# Patient Record
Sex: Male | Born: 1995 | Race: Black or African American | Hispanic: No | Marital: Single | Smoking: Never smoker
Health system: Southern US, Community
[De-identification: ages and names within clinical notes are randomized; demographics above are authoritative.]

## PROBLEM LIST (undated history)

## (undated) DIAGNOSIS — A549 Gonococcal infection, unspecified: Secondary | ICD-10-CM

## (undated) DIAGNOSIS — F909 Attention-deficit hyperactivity disorder, unspecified type: Secondary | ICD-10-CM

## (undated) DIAGNOSIS — N390 Urinary tract infection, site not specified: Secondary | ICD-10-CM

## (undated) DIAGNOSIS — A749 Chlamydial infection, unspecified: Secondary | ICD-10-CM

## (undated) HISTORY — DX: Gonococcal infection, unspecified: A54.9

## (undated) HISTORY — DX: Urinary tract infection, site not specified: N39.0

## (undated) HISTORY — DX: Chlamydial infection, unspecified: A74.9

## (undated) HISTORY — PX: HERNIA REPAIR: SHX51

---
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
## 2017-05-02 ENCOUNTER — Emergency Department (HOSPITAL_COMMUNITY)
Admission: EM | Admit: 2017-05-02 | Discharge: 2017-05-02 | Disposition: A | Discharge status: Home / Self Care | Payer: Medicaid Other | Attending: Emergency Medicine | Admitting: Emergency Medicine

## 2017-05-02 ENCOUNTER — Emergency Department (HOSPITAL_COMMUNITY): Payer: Medicaid Other

## 2017-05-02 ENCOUNTER — Encounter (HOSPITAL_COMMUNITY): Payer: Self-pay | Admitting: Emergency Medicine

## 2017-05-02 DIAGNOSIS — F909 Attention-deficit hyperactivity disorder, unspecified type: Secondary | ICD-10-CM | POA: Insufficient documentation

## 2017-05-02 DIAGNOSIS — S3992XA Unspecified injury of lower back, initial encounter: Secondary | ICD-10-CM | POA: Diagnosis present

## 2017-05-02 DIAGNOSIS — W108XXA Fall (on) (from) other stairs and steps, initial encounter: Secondary | ICD-10-CM | POA: Insufficient documentation

## 2017-05-02 DIAGNOSIS — W19XXXA Unspecified fall, initial encounter: Secondary | ICD-10-CM

## 2017-05-02 DIAGNOSIS — Y9259 Other trade areas as the place of occurrence of the external cause: Secondary | ICD-10-CM | POA: Diagnosis not present

## 2017-05-02 DIAGNOSIS — Y990 Civilian activity done for income or pay: Secondary | ICD-10-CM | POA: Diagnosis not present

## 2017-05-02 DIAGNOSIS — Y9301 Activity, walking, marching and hiking: Secondary | ICD-10-CM | POA: Diagnosis not present

## 2017-05-02 DIAGNOSIS — M545 Low back pain, unspecified: Secondary | ICD-10-CM

## 2017-05-02 DIAGNOSIS — Z79899 Other long term (current) drug therapy: Secondary | ICD-10-CM | POA: Insufficient documentation

## 2017-05-02 MED ORDER — NAPROXEN 375 MG PO TABS: 375.0000 mg | ORAL_TABLET | Freq: Two times a day (BID) | ORAL | 0 refills | Status: DC

## 2017-05-02 MED ORDER — IBUPROFEN 200 MG PO TABS
600.0000 mg | ORAL_TABLET | Freq: Once | ORAL | Status: DC
Start: 2017-05-02 — End: 2017-05-02

## 2017-05-02 MED ORDER — METHOCARBAMOL 500 MG PO TABS: 500.0000 mg | ORAL_TABLET | Freq: Two times a day (BID) | ORAL | 0 refills | Status: DC

## 2017-05-02 NOTE — Discharge Instructions (Signed)
Xray showed no fracture.   Please take the Naproxen as prescribed for pain. Do not take any additional NSAIDs including Motrin, Aleve, Ibuprofen, Advil.  Please the the Robaxin for muscle relaxation. This medication will make you drowsy so avoid situation that could place you in danger.   Workup has been normal. Please take medications as prescribed and instructed.  SEEK IMMEDIATE MEDICAL ATTENTION IF: New numbness, tingling, weakness, or problem with the use of your arms or legs.  Severe back pain not relieved with medications.  Change in bowel or bladder control.  Urinary retention.  Numbness in your groin.  Increasing pain in any areas of the body (such as chest or abdominal pain).  Shortness of breath, dizziness or fainting.  Nausea (feeling sick to your stomach), vomiting, fever, or sweats.

## 2017-05-02 NOTE — ED Triage Notes (Signed)
Pt stated that he fell down a few steps last night. The impact was to his coccyx. Pt denies LOC. Pt did not treat yesterday. Pain increased this am. Mother at bedside

## 2017-05-02 NOTE — ED Provider Notes (Signed)
[facility]-EMERGENCY DEPT Provider Note   CSN: [id number] Arrival date & time: 05/02/17  1159     History   Chief Complaint Chief Complaint  Patient presents with  . Fall  . Tailbone Pain    HPI Frank Crawford is a 21 y.o. male.  HPI 21 year old African-American male past medical history significant for ADHD presents to the ED today with complaints of low back and tailbone pain. Patient states that last night he was walking out of work and slipped and fell onto his pulse ox on the metal stairs. Patient denies LOC or head injury. States he felt okay last night however this a.m. the pain increased. Patient has only taken Motrin home for his symptoms last night. Sitting makes the pain worse. Nothing makes the pain better. Patient is able to ambulate. Pt denies any ha, night sweats, hx of ivdu/cancer, loss or bowel or bladder, urinary retention, saddle paresthesias, lower extremity paresthesias. The patient denies any abdominal pain, chest pain, shortness breath, lightheadedness, dizziness, nausea, emesis, urinary symptoms, change in bowel habits.  Past Medical History:  Diagnosis Date  . ADHD (attention deficit hyperactivity disorder)     There are no active problems to display for this patient.   Past Surgical History:  Procedure Laterality Date  . HERNIA REPAIR         Home Medications    Prior to Admission medications   Medication Sig Start Date End Date Taking? Authorizing Provider  ibuprofen (ADVIL,MOTRIN) 600 MG tablet Take 1 tablet (600 mg total) by mouth every 6 (six) hours as needed. 06/11/14   Antony Madura, PA-C  methocarbamol (ROBAXIN) 500 MG tablet Take 1 tablet (500 mg total) by mouth 2 (two) times daily. 05/02/17   Rise Mu, PA-C  naproxen (NAPROSYN) 375 MG tablet Take 1 tablet (375 mg total) by mouth 2 (two) times daily. 05/02/17   Rise Mu, PA-C  pseudoephedrine-guaifenesin (MUCINEX D) 60-600 MG per tablet Take 1 tablet by mouth every 12 (twelve)  hours.    [provider]    Family History Family History  Problem Relation Age of Onset  . Diabetes Other   . Seizures Other   . Seizures Brother     Social History Social History  Substance Use Topics  . Smoking status: Never Smoker  . Smokeless tobacco: Not on file  . Alcohol use No     Allergies   Apple   Review of Systems Review of Systems  Constitutional: Negative for chills and fever.  Eyes: Negative for visual disturbance.  Respiratory: Negative for cough and shortness of breath.   Cardiovascular: Negative for chest pain.  Gastrointestinal: Negative for abdominal pain, diarrhea, nausea and vomiting.  Genitourinary: Negative for decreased urine volume, frequency, hematuria and urgency.  Musculoskeletal: Positive for arthralgias and back pain. Negative for myalgias, neck pain and neck stiffness.  Skin: Negative for rash.  Neurological: Negative for dizziness, syncope, weakness, light-headedness, numbness and headaches.  Psychiatric/Behavioral: Negative for sleep disturbance. The patient is not nervous/anxious.      Physical Exam Updated Vital Signs BP 129/82 (BP Location: Right Arm)   Pulse 68   Temp 98.7 F (37.1 C) (Oral)   Resp 18   SpO2 100%   Physical Exam  Constitutional: He is oriented to person, place, and time. He appears well-developed and well-nourished. No distress.  HENT:  Head: Normocephalic and atraumatic.  Eyes: Right eye exhibits no discharge. Left eye exhibits no discharge. No scleral icterus.  Neck: Normal range of motion.  Cardiovascular: Intact distal pulses.   Pulmonary/Chest: No respiratory distress.  Musculoskeletal: Normal range of motion.  No midline T spine  tenderness. No deformities or step offs noted. Full ROM. Pelvis is stable.  Midline L-spine tenderness without any step-offs or deformities. Bilateral paraspinal tenderness. Tenderness to palpation of the coccyx.   Neurological: He is alert and oriented to  person, place, and time.  Strength 5 over 5 in lower extremities. Patellar reflexes are normal. Sensation intact in all dermatomes.  Skin: Skin is warm and dry. Capillary refill takes less than 2 seconds. No pallor.  Psychiatric: His behavior is normal. Judgment and thought content normal.  Nursing note and vitals reviewed.    ED Treatments / Results  Labs (all labs ordered are listed, but only abnormal results are displayed) Labs Reviewed - No data to display  EKG  EKG Interpretation None       Radiology Dg Lumbar Spine Complete  Result Date: 05/02/2017 CLINICAL DATA:  21 year old male status post fall down stairs last night landing on buttocks. Pain. EXAM: LUMBAR SPINE - COMPLETE 4+ VIEW COMPARISON:  Sacrococcygeal series from today reported separately. Abdominal series 417 2011. FINDINGS: Normal lumbar segmentation. Bone mineralization is within normal limits. The patient is approaching skeletal maturity. Disc spaces are preserved. Vertebral height and alignment within normal limits. No pars fracture. Visible lower thoracic levels appear intact. Visible sacrum appears within normal limits. Negative visible bowel gas pattern. IMPRESSION: Normal radiographic appearance of the lumbar spine. Electronically Signed   By: Odessa Fleming M.D.   On: 05/02/2017 12:49   Dg Sacrum/coccyx  Result Date: 05/02/2017 CLINICAL DATA:  21 year old male status post fall down steps landing on buttocks last night. Pain. EXAM: SACRUM AND COCCYX - 2+ VIEW COMPARISON:  01/08/2010 abdominal series FINDINGS: The patient is nearing skeletal maturity. Bone mineralization is within normal limits. Sacral ala and SI joints appear normal. On the lateral view the sacral segments and coccygeal segments appear within normal limits. The visible lower lumbar levels and pelvis also appear intact. Negative visible bowel gas pattern with some retained stool in the colon. IMPRESSION: No acute fracture identified in the sacrum or coccyx.  Electronically Signed   By: Odessa Fleming M.D.   On: 05/02/2017 12:48    Procedures Procedures (including critical care time)  Medications Ordered in ED Medications  ibuprofen (ADVIL,MOTRIN) tablet 600 mg (not administered)     Initial Impression / Assessment and Plan / ED Course  I have reviewed the triage vital signs and the nursing notes.  Pertinent labs & imaging results that were available during my care of the patient were reviewed by me and considered in my medical decision making (see chart for details).     Patient with back pain after mechanical fall last night.  No neurological deficits and normal neuro exam.  Patient can walk but states is painful.  No loss of bowel or bladder control.  No concern for cauda equina.  No fever, night sweats, weight loss, h/o cancer, IVDU.  RICE protocol and pain medicine indicated and discussed with patient.    Final Clinical Impressions(s) / ED Diagnoses   Final diagnoses:  Fall  Acute midline low back pain without sciatica  Tailbone injury, initial encounter    New Prescriptions New Prescriptions   METHOCARBAMOL (ROBAXIN) 500 MG TABLET    Take 1 tablet (500 mg total) by mouth 2 (two) times daily.   NAPROXEN (NAPROSYN) 375 MG TABLET    Take 1 tablet (375 mg total)  by mouth 2 (two) times daily.     Rise MuLeaphart, Kenneth T, PA-C 05/02/17 1310    Melene PlanFloyd, Dan, DO 05/02/17 1635

## 2018-02-21 ENCOUNTER — Ambulatory Visit: Payer: Self-pay | Admitting: Family Medicine

## 2019-07-25 ENCOUNTER — Emergency Department (HOSPITAL_COMMUNITY): Payer: Medicaid Other

## 2019-07-25 ENCOUNTER — Other Ambulatory Visit: Payer: Self-pay

## 2019-07-25 ENCOUNTER — Emergency Department (HOSPITAL_COMMUNITY)
Admission: EM | Admit: 2019-07-25 | Discharge: 2019-07-25 | Disposition: A | Discharge status: Home / Self Care | Payer: Medicaid Other | Attending: Emergency Medicine | Admitting: Emergency Medicine

## 2019-07-25 DIAGNOSIS — W228XXA Striking against or struck by other objects, initial encounter: Secondary | ICD-10-CM | POA: Diagnosis not present

## 2019-07-25 DIAGNOSIS — S6991XA Unspecified injury of right wrist, hand and finger(s), initial encounter: Secondary | ICD-10-CM | POA: Diagnosis present

## 2019-07-25 DIAGNOSIS — Y929 Unspecified place or not applicable: Secondary | ICD-10-CM | POA: Insufficient documentation

## 2019-07-25 DIAGNOSIS — Y999 Unspecified external cause status: Secondary | ICD-10-CM | POA: Insufficient documentation

## 2019-07-25 DIAGNOSIS — S62336A Displaced fracture of neck of fifth metacarpal bone, right hand, initial encounter for closed fracture: Secondary | ICD-10-CM | POA: Insufficient documentation

## 2019-07-25 DIAGNOSIS — Y9389 Activity, other specified: Secondary | ICD-10-CM | POA: Insufficient documentation

## 2019-07-25 DIAGNOSIS — Z79899 Other long term (current) drug therapy: Secondary | ICD-10-CM | POA: Insufficient documentation

## 2019-07-25 MED ORDER — IBUPROFEN 600 MG PO TABS: 600.0000 mg | ORAL_TABLET | Freq: Four times a day (QID) | ORAL | 0 refills | Status: DC | PRN

## 2019-07-25 MED ORDER — HYDROCODONE-ACETAMINOPHEN 5-325 MG PO TABS
1.0000 | ORAL_TABLET | Freq: Once | ORAL | Status: AC
  Administered 2019-07-25: 13:00:00 1 via ORAL
  Filled 2019-07-25: qty 1
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
## 2019-07-25 NOTE — Discharge Instructions (Addendum)
Please read instructions below. Keep your splint clean and dry and in place at all times.  Elevate your hand as much as possible to help with swelling. You can take ibuprofen every 6 hours as needed for pain. Schedule an appointment with the orthopedic hand specialist in 1-2 weeks for repeat x-ray and follow-up on your injury. Return to the ER for new or concerning symptoms.
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
## 2020-07-21 ENCOUNTER — Emergency Department (INDEPENDENT_AMBULATORY_CARE_PROVIDER_SITE_OTHER)
Admission: EM | Admit: 2020-07-21 | Discharge: 2020-07-21 | Disposition: A | Discharge status: Home / Self Care | Payer: Medicaid Other | Source: Home / Self Care

## 2020-07-21 ENCOUNTER — Other Ambulatory Visit: Payer: Self-pay

## 2020-07-21 DIAGNOSIS — Z202 Contact with and (suspected) exposure to infections with a predominantly sexual mode of transmission: Secondary | ICD-10-CM

## 2020-07-21 MED ORDER — DOXYCYCLINE HYCLATE 100 MG PO CAPS: 100.0000 mg | ORAL_CAPSULE | Freq: Two times a day (BID) | ORAL | 0 refills | Status: AC

## 2020-07-21 MED ORDER — DOXYCYCLINE HYCLATE 100 MG PO CAPS: 100.0000 mg | ORAL_CAPSULE | Freq: Two times a day (BID) | ORAL | 0 refills | Status: DC

## 2020-07-21 MED ORDER — CEFTRIAXONE SODIUM 500 MG IJ SOLR
500.0000 mg | Freq: Once | INTRAMUSCULAR | Status: AC
  Administered 2020-07-21: 500 mg via INTRAMUSCULAR

## 2020-07-21 NOTE — Discharge Instructions (Signed)
°  Refrain from sexual intercourse for 7 days. Be sure to have all partners tested and treated for STDs.  Practice safe sex by always wearing condoms.  ° °

## 2020-07-21 NOTE — ED Triage Notes (Signed)
Pt presents for STI screening. He is an relationship with his fiance which was diagnosed with gonorrhea.

## 2020-07-21 NOTE — ED Provider Notes (Signed)
[name]    CSN: [id number] Arrival date & time: 07/21/20  1012      History   Chief Complaint Chief Complaint  Patient presents with  . Exposure to STD    HPI Frank Crawford is a 24 y.o. male.   HPI  Frank Crawford is a 24 y.o. male presenting to UC with request for STI testing and treatment after being exposed to gonorrhea. Pt reports his fiance tested positive for gonorrhea recently. Pt reports mild intermittent lower back pain and abdominal pain. Denies dysuria or penile discharge. He has had unprotected sex with his male partner. Denies fever, chills, n/v/d.    Past Medical History:  Diagnosis Date  . ADHD (attention deficit hyperactivity disorder)     There are no problems to display for this patient.   Past Surgical History:  Procedure Laterality Date  . HERNIA REPAIR         Home Medications    Prior to Admission medications   Medication Sig Start Date End Date Taking? Authorizing Provider  doxycycline (VIBRAMYCIN) 100 MG capsule Take 1 capsule (100 mg total) by mouth 2 (two) times daily for 7 days. 07/21/20 07/28/20  Lurene Shadow, PA-C  ibuprofen (ADVIL) 600 MG tablet Take 1 tablet (600 mg total) by mouth every 6 (six) hours as needed. 07/25/19   Robinson, [place] N, PA-C  methocarbamol (ROBAXIN) 500 MG tablet Take 1 tablet (500 mg total) by mouth 2 (two) times daily. 05/02/17   Rise Mu, PA-C  naproxen (NAPROSYN) 375 MG tablet Take 1 tablet (375 mg total) by mouth 2 (two) times daily. 05/02/17   Rise Mu, PA-C  pseudoephedrine-guaifenesin (MUCINEX D) 60-600 MG per tablet Take 1 tablet by mouth every 12 (twelve) hours.    [provider]    Family History Family History  Problem Relation Age of Onset  . Diabetes Other   . Seizures Other   . Seizures Brother   . Diabetes Mother   . Hypertension Father     Social History Social History   Tobacco Use  . Smoking status: Never Smoker  . Smokeless tobacco:  Never Used  Vaping Use  . Vaping Use: Every day  Substance Use Topics  . Alcohol use: Yes    Alcohol/week: 2.0 standard drinks    Types: 2 Shots of liquor per week    Comment: occasional   . Drug use: No     Allergies   Apple   Review of Systems Review of Systems  Constitutional: Negative for chills and fever.  Gastrointestinal: Positive for abdominal pain. Negative for diarrhea, nausea and vomiting.  Genitourinary: Negative for discharge, dysuria, frequency, genital sores, hematuria and testicular pain.  Musculoskeletal: Positive for back pain.  Skin: Negative for rash.     Physical Exam Triage Vital Signs ED Triage Vitals  Enc Vitals Group     BP 07/21/20 1039 139/84     Pulse Rate 07/21/20 1039 78     Resp 07/21/20 1039 18     Temp 07/21/20 1039 99 F (37.2 C)     Temp Source 07/21/20 1039 Oral     SpO2 07/21/20 1039 99 %     Weight 07/21/20 1033 162 lb (73.5 kg)     Height 07/21/20 1033 5\' 7"  (1.702 m)     Head Circumference --      Peak Flow --      Pain Score 07/21/20 1042 0     Pain Loc --  Pain Edu? --      Excl. in GC? --    No data found.  Updated Vital Signs BP 139/84 (BP Location: Left Arm)   Pulse 78   Temp 99 F (37.2 C) (Oral)   Resp 18   Ht 5\' 7"  (1.702 m)   Wt 162 lb (73.5 kg)   SpO2 99%   BMI 25.37 kg/m   Visual Acuity Right Eye Distance:   Left Eye Distance:   Bilateral Distance:    Right Eye Near:   Left Eye Near:    Bilateral Near:     Physical Exam Vitals and nursing note reviewed.  Constitutional:      General: He is not in acute distress.    Appearance: Normal appearance. He is well-developed. He is not ill-appearing, toxic-appearing or diaphoretic.  HENT:     Head: Normocephalic and atraumatic.  Cardiovascular:     Rate and Rhythm: Normal rate and regular rhythm.  Pulmonary:     Effort: Pulmonary effort is normal. No respiratory distress.     Breath sounds: Normal breath sounds.  Abdominal:     General:  There is no distension.     Palpations: Abdomen is soft.     Tenderness: There is abdominal tenderness (mild suprapubic). There is no right CVA tenderness or left CVA tenderness.  Genitourinary:    Comments: deferred Musculoskeletal:        General: Normal range of motion.     Cervical back: Normal range of motion.  Skin:    General: Skin is warm and dry.  Neurological:     Mental Status: He is alert and oriented to person, place, and time.  Psychiatric:        Behavior: Behavior normal.      UC Treatments / Results  Labs (all labs ordered are listed, but only abnormal results are displayed) Labs Reviewed  C. TRACHOMATIS/N. GONORRHOEAE RNA    EKG   Radiology No results found.  Procedures Procedures (including critical care time)  Medications Ordered in UC Medications  cefTRIAXone (ROCEPHIN) injection 500 mg (500 mg Intramuscular Given 07/21/20 1101)    Initial Impression / Assessment and Plan / UC Course  I have reviewed the triage vital signs and the nursing notes.  Pertinent labs & imaging results that were available during my care of the patient were reviewed by me and considered in my medical decision making (see chart for details).     Will tx empirically for gonorrhea Urine sample sent to lab Pt declined testing for HIV or syphilis  F/u with PCP as needed AVS given  Final Clinical Impressions(s) / UC Diagnoses   Final diagnoses:  Exposure to gonorrhea     Discharge Instructions      Refrain from sexual intercourse for 7 days. Be sure to have all partners tested and treated for STDs.  Practice safe sex by always wearing condoms.      ED Prescriptions    Medication Sig Dispense Auth. Provider   doxycycline (VIBRAMYCIN) 100 MG capsule  (Status: Discontinued) Take 1 capsule (100 mg total) by mouth 2 (two) times daily for 7 days. 14 capsule 07/23/20, Kirsten Mckone O, PA-C   doxycycline (VIBRAMYCIN) 100 MG capsule Take 1 capsule (100 mg total) by mouth 2  (two) times daily for 7 days. 14 capsule Doroteo Glassman, Lurene Shadow     PDMP not reviewed this encounter.   [place], Lurene Shadow 07/21/20 1159

## 2020-07-22 LAB — C. TRACHOMATIS/N. GONORRHOEAE RNA
C. trachomatis RNA, TMA: NOT DETECTED
N. gonorrhoeae RNA, TMA: NOT DETECTED

## 2020-08-16 ENCOUNTER — Other Ambulatory Visit: Payer: Self-pay

## 2020-08-16 ENCOUNTER — Encounter: Payer: Self-pay | Admitting: Emergency Medicine

## 2020-08-16 ENCOUNTER — Emergency Department (INDEPENDENT_AMBULATORY_CARE_PROVIDER_SITE_OTHER)
Admission: EM | Admit: 2020-08-16 | Discharge: 2020-08-16 | Disposition: A | Discharge status: Home / Self Care | Payer: Medicaid Other | Source: Home / Self Care

## 2020-08-16 DIAGNOSIS — R1030 Lower abdominal pain, unspecified: Secondary | ICD-10-CM

## 2020-08-16 DIAGNOSIS — R350 Frequency of micturition: Secondary | ICD-10-CM

## 2020-08-16 LAB — POCT URINALYSIS DIP (MANUAL ENTRY)
Bilirubin, UA: NEGATIVE
Blood, UA: NEGATIVE
Glucose, UA: NEGATIVE mg/dL
Ketones, POC UA: NEGATIVE mg/dL
Leukocytes, UA: NEGATIVE
Nitrite, UA: NEGATIVE
Spec Grav, UA: 1.025 (ref 1.010–1.025)
Urobilinogen, UA: 0.2 E.U./dL
pH, UA: 6.5 (ref 5.0–8.0)

## 2020-08-16 MED ORDER — POLYETHYLENE GLYCOL 3350 17 GM/SCOOP PO POWD: 15.0000 g | Freq: Once | ORAL | 0 refills | Status: AC

## 2020-08-16 NOTE — ED Provider Notes (Signed)
[facility]    CSN: [id number] Arrival date & time: 08/16/20  1028      History   Chief Complaint Chief Complaint  Patient presents with  . Abdominal Pain    HPI Frank Crawford is a 24 y.o. male.   This is an established [facility] patient, age 65 years old.  Nausea, stomach pain, frequent urination, urgency started today. Was treated for gonorrhea recently. Vaccinated     Past Medical History:  Diagnosis Date  . ADHD (attention deficit hyperactivity disorder)     There are no problems to display for this patient.   Past Surgical History:  Procedure Laterality Date  . HERNIA REPAIR         Home Medications    Prior to Admission medications   Medication Sig Start Date End Date Taking? Authorizing Provider  ibuprofen (ADVIL) 600 MG tablet Take 1 tablet (600 mg total) by mouth every 6 (six) hours as needed. 07/25/19   Robinson, [place] N, PA-C  polyethylene glycol powder (MIRALAX) 17 GM/SCOOP powder Take 15 g by mouth once for 1 dose. One scoop in 8 oz water daily 08/16/20 08/16/20  Elvina Sidle, MD    Family History Family History  Problem Relation Age of Onset  . Diabetes Other   . Seizures Other   . Seizures Brother   . Diabetes Mother   . Hypertension Father     Social History Social History   Tobacco Use  . Smoking status: Never Smoker  . Smokeless tobacco: Never Used  Vaping Use  . Vaping Use: Every day  Substance Use Topics  . Alcohol use: Yes    Alcohol/week: 2.0 standard drinks    Types: 2 Shots of liquor per week    Comment: occasional   . Drug use: No     Allergies   Apple   Review of Systems Review of Systems  Constitutional: Negative.   Gastrointestinal: Positive for abdominal distention and abdominal pain. Negative for nausea and vomiting.  Genitourinary: Positive for frequency and urgency. Negative for discharge and dysuria.     Physical Exam Triage Vital Signs ED Triage Vitals  Enc  Vitals Group     BP 08/16/20 1045 (!) 135/91     Pulse Rate 08/16/20 1045 68     Resp 08/16/20 1045 16     Temp 08/16/20 1045 98 F (36.7 C)     Temp Source 08/16/20 1045 Oral     SpO2 08/16/20 1045 98 %     Weight 08/16/20 1047 166 lb (75.3 kg)     Height 08/16/20 1047 5\' 7"  (1.702 m)     Head Circumference --      Peak Flow --      Pain Score 08/16/20 1047 6     Pain Loc --      Pain Edu? --      Excl. in GC? --    No data found.  Updated Vital Signs BP (!) 135/91 (BP Location: Right Arm)   Pulse 68   Temp 98 F (36.7 C) (Oral)   Resp 16   Ht 5\' 7"  (1.702 m)   Wt 75.3 kg   SpO2 98%   BMI 26.00 kg/m    Physical Exam Vitals and nursing note reviewed.  Constitutional:      General: He is not in acute distress.    Appearance: He is well-developed and normal weight.  HENT:     Head: Normocephalic.  Cardiovascular:  Rate and Rhythm: Normal rate.  Pulmonary:     Effort: Pulmonary effort is normal.  Abdominal:     General: Abdomen is flat. Bowel sounds are normal.     Palpations: Abdomen is soft.     Tenderness: There is abdominal tenderness in the right lower quadrant. There is no guarding or rebound. Negative signs include Murphy's sign and McBurney's sign.  Skin:    General: Skin is warm and dry.  Neurological:     General: No focal deficit present.     Mental Status: He is alert.  Psychiatric:        Mood and Affect: Mood normal.        Behavior: Behavior normal.      UC Treatments / Results  Labs (all labs ordered are listed, but only abnormal results are displayed) Labs Reviewed  POCT URINALYSIS DIP (MANUAL ENTRY) - Abnormal; Notable for the following components:      Result Value   Protein Ur, POC trace (*)    All other components within normal limits    EKG   Radiology No results found.  Procedures Procedures (including critical care time)  Medications Ordered in UC Medications - No data to display  Initial Impression / Assessment  and Plan / UC Course  I have reviewed the triage vital signs and the nursing notes.  Pertinent labs & imaging results that were available during my care of the patient were reviewed by me and considered in my medical decision making (see chart for details).    Final Clinical Impressions(s) / UC Diagnoses   Final diagnoses:  Lower abdominal pain  Frequency of urination     Discharge Instructions     Drink plenty of fluid daily.  Urine test is normal.    ED Prescriptions    Medication Sig Dispense Auth. Provider   polyethylene glycol powder (MIRALAX) 17 GM/SCOOP powder Take 15 g by mouth once for 1 dose. One scoop in 8 oz water daily 255 g Elvina Sidle, MD     PDMP not reviewed this encounter.   Elvina Sidle, MD 08/16/20 [phone]

## 2020-08-16 NOTE — ED Triage Notes (Addendum)
Nausea, stomach pain, frequent urination, urgency started today. Was treated for gonorrhea recently. Vaccinated

## 2020-08-16 NOTE — Discharge Instructions (Signed)
Drink plenty of fluid daily.  Urine test is normal.

## 2020-09-19 ENCOUNTER — Emergency Department (INDEPENDENT_AMBULATORY_CARE_PROVIDER_SITE_OTHER)
Admission: EM | Admit: 2020-09-19 | Discharge: 2020-09-19 | Disposition: A | Discharge status: Home / Self Care | Payer: Medicaid Other | Source: Home / Self Care

## 2020-09-19 ENCOUNTER — Other Ambulatory Visit: Payer: Self-pay

## 2020-09-19 ENCOUNTER — Encounter: Payer: Self-pay | Admitting: Emergency Medicine

## 2020-09-19 DIAGNOSIS — R519 Headache, unspecified: Secondary | ICD-10-CM | POA: Diagnosis not present

## 2020-09-19 MED ORDER — AMOXICILLIN 500 MG PO CAPS: 500.0000 mg | ORAL_CAPSULE | Freq: Three times a day (TID) | ORAL | 0 refills | Status: DC

## 2020-09-19 NOTE — ED Provider Notes (Signed)
[name]    CSN: [id number] Arrival date & time: 09/19/20  1912      History   Chief Complaint Chief Complaint  Patient presents with  . Headache    HPI Frank Crawford is a 24 y.o. male.  Complains of headache nausea for 2 days.  Has been vaccinated against Covid and has no concerns letter.  Also has had some cough productive of greenish sputum.  He complains of tenderness in the maxillary sinus area.   HPI  Past Medical History:  Diagnosis Date  . ADHD (attention deficit hyperactivity disorder)     There are no problems to display for this patient.   Past Surgical History:  Procedure Laterality Date  . HERNIA REPAIR         Home Medications    Prior to Admission medications   Medication Sig Start Date End Date Taking? Authorizing Provider  ibuprofen (ADVIL) 600 MG tablet Take 1 tablet (600 mg total) by mouth every 6 (six) hours as needed. 07/25/19   Robinson, [place] N, PA-C    Family History Family History  Problem Relation Age of Onset  . Diabetes Other   . Seizures Other   . Seizures Brother   . Diabetes Mother   . Hypertension Father     Social History Social History   Tobacco Use  . Smoking status: Never Smoker  . Smokeless tobacco: Never Used  Vaping Use  . Vaping Use: Every day  Substance Use Topics  . Alcohol use: Yes    Alcohol/week: 2.0 standard drinks    Types: 2 Shots of liquor per week    Comment: occasional   . Drug use: No     Allergies   Apple   Review of Systems Review of Systems  HENT: Positive for sinus pressure.   Respiratory: Positive for cough.   Neurological: Positive for headaches.  All other systems reviewed and are negative.    Physical Exam Triage Vital Signs ED Triage Vitals  Enc Vitals Group     BP 09/19/20 1930 137/90     Pulse Rate 09/19/20 1930 70     Resp 09/19/20 1930 18     Temp 09/19/20 1930 98.6 F (37 C)     Temp Source 09/19/20 1930 Oral     SpO2 09/19/20 1930 98 %      Weight 09/19/20 1931 168 lb (76.2 kg)     Height 09/19/20 1931 5\' 7"  (1.702 m)     Head Circumference --      Peak Flow --      Pain Score 09/19/20 1930 9     Pain Loc --      Pain Edu? --      Excl. in GC? --    No data found.  Updated Vital Signs BP 137/90 (BP Location: Right Arm)   Pulse 70   Temp 98.6 F (37 C) (Oral)   Resp 18   Ht 5\' 7"  (1.702 m)   Wt 76.2 kg   SpO2 98%   BMI 26.31 kg/m   Visual Acuity Right Eye Distance:   Left Eye Distance:   Bilateral Distance:    Right Eye Near:   Left Eye Near:    Bilateral Near:     Physical Exam Vitals and nursing note reviewed.  Constitutional:      Appearance: He is well-developed.  HENT:     Head:     Comments: Tenderness with percussion maxillary sinuses    Mouth/Throat:  Mouth: Mucous membranes are moist.  Cardiovascular:     Rate and Rhythm: Normal rate and regular rhythm.  Pulmonary:     Effort: Pulmonary effort is normal.     Breath sounds: Normal breath sounds.  Neurological:     Mental Status: He is alert.      UC Treatments / Results  Labs (all labs ordered are listed, but only abnormal results are displayed) Labs Reviewed - No data to display  EKG   Radiology No results found.  Procedures Procedures (including critical care time)  Medications Ordered in UC Medications - No data to display  Initial Impression / Assessment and Plan / UC Course  I have reviewed the triage vital signs and the nursing notes.  Pertinent labs & imaging results that were available during my care of the patient were reviewed by me and considered in my medical decision making (see chart for details).     Headache, possibly sinusitis Final Clinical Impressions(s) / UC Diagnoses   Final diagnoses:  None   Discharge Instructions   None    ED Prescriptions    None     PDMP not reviewed this encounter.   Frederica Kuster, MD 09/19/20 [phone]

## 2020-09-19 NOTE — ED Triage Notes (Signed)
Migraine, nausea x 2 days

## 2020-10-05 ENCOUNTER — Other Ambulatory Visit: Payer: Self-pay

## 2020-10-05 ENCOUNTER — Emergency Department (INDEPENDENT_AMBULATORY_CARE_PROVIDER_SITE_OTHER)
Admission: EM | Admit: 2020-10-05 | Discharge: 2020-10-05 | Disposition: A | Discharge status: Home / Self Care | Payer: Medicaid Other | Source: Home / Self Care

## 2020-10-05 ENCOUNTER — Encounter: Payer: Self-pay | Admitting: Emergency Medicine

## 2020-10-05 DIAGNOSIS — B349 Viral infection, unspecified: Secondary | ICD-10-CM | POA: Diagnosis not present

## 2020-10-05 NOTE — Discharge Instructions (Signed)
Your COVID 19 results should result within 3-5 days. Negative results are immediately resulted to Mychart. Positive results will receive a follow-up call from our clinic. If symptoms are present, I recommend home quarantine until results are known.  Alternate Tylenol and ibuprofen as needed for body aches and fever.  Xyzal or cetirizine for nasal symptom management. Warm salt water gargles for throat pain management. Symptom management per recommendations discussed today.  If any breathing difficulty or chest pain develops go immediately to the closest emergency department for evaluation.

## 2020-10-05 NOTE — ED Triage Notes (Signed)
Body aches, cough, headache, fatigue, fever, chills x yesterday Vaccinated

## 2020-10-05 NOTE — ED Provider Notes (Addendum)
[name]    CSN: [id number] Arrival date & time: 10/05/20  1511      History   Chief Complaint No chief complaint on file.   HPI Frank Crawford is a 25 y.o. male.   HPI  Patient presents today with body aches, cough, headache, fatigue, fever, chills x1 day.  He has a low-grade fever on arrival here today. He is fully vaccinated. Taking OTC management for fever and body aches management. Denies SOB, wheezing, N&V, and chest pain.   Past Medical History:  Diagnosis Date  . ADHD (attention deficit hyperactivity disorder)     There are no problems to display for this patient.   Past Surgical History:  Procedure Laterality Date  . HERNIA REPAIR         Home Medications    Prior to Admission medications   Medication Sig Start Date End Date Taking? Authorizing Provider  Pseudoephedrine-APAP-DM (DAYQUIL MULTI-SYMPTOM COLD/FLU PO) Take by mouth.   Yes [provider]  ibuprofen (ADVIL) 600 MG tablet Take 1 tablet (600 mg total) by mouth every 6 (six) hours as needed. 07/25/19   Robinson, [place] N, PA-C    Family History Family History  Problem Relation Age of Onset  . Diabetes Other   . Seizures Other   . Seizures Brother   . Diabetes Mother   . Hypertension Father     Social History Social History   Tobacco Use  . Smoking status: Never Smoker  . Smokeless tobacco: Never Used  Vaping Use  . Vaping Use: Every day  Substance Use Topics  . Alcohol use: Yes    Alcohol/week: 2.0 standard drinks    Types: 2 Shots of liquor per week    Comment: occasional   . Drug use: No     Allergies   Apple   Review of Systems Review of Systems Pertinent negatives listed in HPI   Physical Exam Triage Vital Signs ED Triage Vitals  Enc Vitals Group     BP 10/05/20 1623 124/85     Pulse Rate 10/05/20 1623 95     Resp 10/05/20 1623 20     Temp 10/05/20 1623 99.1 F (37.3 C)     Temp Source 10/05/20 1623 Oral     SpO2 10/05/20 1623 96 %      Weight 10/05/20 1624 168 lb (76.2 kg)     Height 10/05/20 1624 5\' 7"  (1.702 m)     Head Circumference --      Peak Flow --      Pain Score 10/05/20 1624 9     Pain Loc --      Pain Edu? --      Excl. in GC? --    No data found.  Updated Vital Signs BP 124/85 (BP Location: Right Arm)   Pulse 95   Temp 99.1 F (37.3 C) (Oral)   Resp 20   Ht 5\' 7"  (1.702 m)   Wt 168 lb (76.2 kg)   SpO2 96%   BMI 26.31 kg/m   Visual Acuity Right Eye Distance:   Left Eye Distance:   Bilateral Distance:    Right Eye Near:   Left Eye Near:    Bilateral Near:     Physical Exam  General Appearance:    Alert, acutely ill appearing,  cooperative, no distress  HENT:   Normocephalic, ears normal, nares mucosal edema with congestion, rhinorrhea, oropharynx w/o edema/erythema/exudate  Eyes:    PERRL, conjunctiva/corneas clear, EOM's intact  Lungs:     Clear to auscultation bilaterally, respirations unlabored  Heart:    Regular rate and rhythm  Neurologic:   Awake, alert, oriented x 3. No apparent focal neurological           defect.      UC Treatments / Results  Labs (all labs ordered are listed, but only abnormal results are displayed) Labs Reviewed - No data to display  EKG   Radiology No results found.  Procedures Procedures (including critical care time)  Medications Ordered in UC Medications - No data to display  Initial Impression / Assessment and Plan / UC Course  I have reviewed the triage vital signs and the nursing notes.  Pertinent labs & imaging results that were available during my care of the patient were reviewed by me and considered in my medical decision making (see chart for details).    COVID/Flu test pending. Symptom management warranted only.  Manage fever with Tylenol and ibuprofen.  Nasal symptoms with over-the-counter antihistamines recommended.  Treatment per discharge medications/discharge instructions.  Red flags/ER precautions given. The most  current CDC isolation/quarantine recommendation advised.  Final Clinical Impressions(s) / UC Diagnoses   Final diagnoses:  Viral illness     Discharge Instructions     Your COVID 19 results should result within 3-5 days. Negative results are immediately resulted to Mychart. Positive results will receive a follow-up call from our clinic. If symptoms are present, I recommend home quarantine until results are known.  Alternate Tylenol and ibuprofen as needed for body aches and fever.  Xyzal or cetirizine for nasal symptom management. Warm salt water gargles for throat pain management. Symptom management per recommendations discussed today.  If any breathing difficulty or chest pain develops go immediately to the closest emergency department for evaluation.     ED Prescriptions    None     PDMP not reviewed this encounter.   Bing Neighbors, FNP 10/09/20 0033    Bing Neighbors, FNP 10/09/20 [phone]

## 2020-10-09 LAB — COVID-19, FLU A+B NAA
Influenza A, NAA: NOT DETECTED
Influenza B, NAA: NOT DETECTED
SARS-CoV-2, NAA: DETECTED — AB

## 2021-01-09 ENCOUNTER — Encounter (HOSPITAL_BASED_OUTPATIENT_CLINIC_OR_DEPARTMENT_OTHER): Payer: Self-pay | Admitting: *Deleted

## 2021-01-09 ENCOUNTER — Other Ambulatory Visit: Payer: Self-pay

## 2021-01-09 ENCOUNTER — Emergency Department (HOSPITAL_BASED_OUTPATIENT_CLINIC_OR_DEPARTMENT_OTHER)
Admission: EM | Admit: 2021-01-09 | Discharge: 2021-01-10 | Disposition: A | Discharge status: Home / Self Care | Payer: Medicaid Other | Attending: Emergency Medicine | Admitting: Emergency Medicine

## 2021-01-09 DIAGNOSIS — R110 Nausea: Secondary | ICD-10-CM | POA: Insufficient documentation

## 2021-01-09 DIAGNOSIS — S39011A Strain of muscle, fascia and tendon of abdomen, initial encounter: Secondary | ICD-10-CM | POA: Diagnosis not present

## 2021-01-09 DIAGNOSIS — X58XXXA Exposure to other specified factors, initial encounter: Secondary | ICD-10-CM | POA: Diagnosis not present

## 2021-01-09 DIAGNOSIS — S3991XA Unspecified injury of abdomen, initial encounter: Secondary | ICD-10-CM | POA: Diagnosis present

## 2021-01-09 LAB — URINALYSIS, ROUTINE W REFLEX MICROSCOPIC
Bilirubin Urine: NEGATIVE
Glucose, UA: NEGATIVE mg/dL
Hgb urine dipstick: NEGATIVE
Ketones, ur: NEGATIVE mg/dL
Leukocytes,Ua: NEGATIVE
Nitrite: NEGATIVE
Protein, ur: NEGATIVE mg/dL
Specific Gravity, Urine: 1.015 (ref 1.005–1.030)
pH: 7 (ref 5.0–8.0)

## 2021-01-09 NOTE — ED Triage Notes (Signed)
Abdominal pain for a week. Sharp pain mid abdomen. States he had a hernia repair in the area of the pain.

## 2021-01-10 ENCOUNTER — Emergency Department (HOSPITAL_BASED_OUTPATIENT_CLINIC_OR_DEPARTMENT_OTHER): Payer: Medicaid Other

## 2021-01-10 LAB — CBC WITH DIFFERENTIAL/PLATELET
Abs Immature Granulocytes: 0.01 10*3/uL (ref 0.00–0.07)
Basophils Absolute: 0 10*3/uL (ref 0.0–0.1)
Basophils Relative: 1 %
Eosinophils Absolute: 0.2 10*3/uL (ref 0.0–0.5)
Eosinophils Relative: 3 %
HCT: 44 % (ref 39.0–52.0)
Hemoglobin: 14.9 g/dL (ref 13.0–17.0)
Immature Granulocytes: 0 %
Lymphocytes Relative: 48 %
Lymphs Abs: 3.2 10*3/uL (ref 0.7–4.0)
MCH: 27.7 pg (ref 26.0–34.0)
MCHC: 33.9 g/dL (ref 30.0–36.0)
MCV: 81.8 fL (ref 80.0–100.0)
Monocytes Absolute: 0.9 10*3/uL (ref 0.1–1.0)
Monocytes Relative: 14 %
Neutro Abs: 2.2 10*3/uL (ref 1.7–7.7)
Neutrophils Relative %: 34 %
Platelets: 314 10*3/uL (ref 150–400)
RBC: 5.38 MIL/uL (ref 4.22–5.81)
RDW: 11.9 % (ref 11.5–15.5)
WBC: 6.5 10*3/uL (ref 4.0–10.5)
nRBC: 0 % (ref 0.0–0.2)

## 2021-01-10 LAB — BASIC METABOLIC PANEL WITH GFR
Anion gap: 10 (ref 5–15)
BUN: 14 mg/dL (ref 6–20)
CO2: 25 mmol/L (ref 22–32)
Calcium: 9 mg/dL (ref 8.9–10.3)
Chloride: 101 mmol/L (ref 98–111)
Creatinine, Ser: 0.76 mg/dL (ref 0.61–1.24)
GFR, Estimated: >60 mL/min
Glucose, Bld: 99 mg/dL (ref 70–99)
Potassium: 3.8 mmol/L (ref 3.5–5.1)
Sodium: 136 mmol/L (ref 135–145)

## 2021-01-10 MED ORDER — IOHEXOL 300 MG/ML  SOLN
100.0000 mL | Freq: Once | INTRAMUSCULAR | Status: AC | PRN
  Administered 2021-01-10: 100 mL via INTRAVENOUS

## 2021-01-10 NOTE — ED Provider Notes (Signed)
[facility] Provider Note: Lowella Dell, MD, FACEP  CSN: [id number] MRN: [id number] ARRIVAL: 01/09/21 at 2230 ROOM: [facility]08/[facility]08   CHIEF COMPLAINT  Abdominal Pain   HISTORY OF PRESENT ILLNESS  01/10/21 12:27 AM Frank Crawford is a 25 y.o. male who states he had some type of hernia repair about 14 years ago.  He is here with 1 week of abdominal pain.  The abdominal pain is in the midline abdomen above the umbilicus.  He describes it as a stretching pain, worse when he stands or lifts.  He rates it as a 10 out of 10 at its worst although it is mild when lying supine presently.  Also when he stands up he notices a bulge to the right of his umbilicus.  He has felt nauseated but has had no vomiting, diarrhea or constipation.   Past Medical History:  Diagnosis Date  . ADHD (attention deficit hyperactivity disorder)     Past Surgical History:  Procedure Laterality Date  . HERNIA REPAIR      Family History  Problem Relation Age of Onset  . Diabetes Other   . Seizures Other   . Seizures Brother   . Diabetes Mother   . Hypertension Father     Social History   Tobacco Use  . Smoking status: Never Smoker  . Smokeless tobacco: Never Used  Vaping Use  . Vaping Use: Every day  Substance Use Topics  . Alcohol use: Yes    Alcohol/week: 2.0 standard drinks    Types: 2 Shots of liquor per week    Comment: occasional   . Drug use: No    Prior to Admission medications   Not on File    Allergies Apple   REVIEW OF SYSTEMS  Negative except as noted here or in the History of Present Illness.   PHYSICAL EXAMINATION  Initial Vital Signs Blood pressure (!) 138/92, pulse (!) 101, temperature 98.4 F (36.9 C), temperature source Oral, resp. rate 18, height 5\' 7"  (1.702 m), weight 76.2 kg, SpO2 98 %.  Examination General: Well-developed, well-nourished male in no acute distress; appearance consistent with age of record HENT: normocephalic; atraumatic Eyes: pupils  equal, round and reactive to light; extraocular muscles intact Neck: supple Heart: regular rate and rhythm; no murmurs, rubs or gallops Lungs: clear to auscultation bilaterally Abdomen: soft; nondistended; mild upper midline tenderness; bulge to the right of the umbilicus when patient stands; no inguinal hernia palpated; bowel sounds present Extremities: No deformity; full range of motion; pulses normal Neurologic: Awake, alert and oriented; motor function intact in all extremities and symmetric; no facial droop Skin: Warm and dry Psychiatric: Normal mood and affect   RESULTS  Summary of this visit's results, reviewed and interpreted by myself:   EKG Interpretation  Date/Time:    Ventricular Rate:    PR Interval:    QRS Duration:   QT Interval:    QTC Calculation:   R Axis:     Text Interpretation:        Laboratory Studies: Results for orders placed or performed during the hospital encounter of 01/09/21 (from the past 24 hour(s))  Urinalysis, Routine w reflex microscopic Urine, Clean Catch     Status: None   Collection Time: 01/09/21 10:47 PM  Result Value Ref Range   Color, Urine YELLOW YELLOW   APPearance CLEAR CLEAR   Specific Gravity, Urine 1.015 1.005 - 1.030   pH 7.0 5.0 - 8.0   Glucose, UA NEGATIVE NEGATIVE mg/dL  Hgb urine dipstick NEGATIVE NEGATIVE   Bilirubin Urine NEGATIVE NEGATIVE   Ketones, ur NEGATIVE NEGATIVE mg/dL   Protein, ur NEGATIVE NEGATIVE mg/dL   Nitrite NEGATIVE NEGATIVE   Leukocytes,Ua NEGATIVE NEGATIVE  CBC with Differential/Platelet     Status: None   Collection Time: 01/10/21 12:47 AM  Result Value Ref Range   WBC 6.5 4.0 - 10.5 K/uL   RBC 5.38 4.22 - 5.81 MIL/uL   Hemoglobin 14.9 13.0 - 17.0 g/dL   HCT 47.6 54.6 - 50.3 %   MCV 81.8 80.0 - 100.0 fL   MCH 27.7 26.0 - 34.0 pg   MCHC 33.9 30.0 - 36.0 g/dL   RDW 54.6 56.8 - 12.7 %   Platelets 314 150 - 400 K/uL   nRBC 0.0 0.0 - 0.2 %   Neutrophils Relative % 34 %   Neutro Abs 2.2 1.7  - 7.7 K/uL   Lymphocytes Relative 48 %   Lymphs Abs 3.2 0.7 - 4.0 K/uL   Monocytes Relative 14 %   Monocytes Absolute 0.9 0.1 - 1.0 K/uL   Eosinophils Relative 3 %   Eosinophils Absolute 0.2 0.0 - 0.5 K/uL   Basophils Relative 1 %   Basophils Absolute 0.0 0.0 - 0.1 K/uL   Immature Granulocytes 0 %   Abs Immature Granulocytes 0.01 0.00 - 0.07 K/uL  Basic metabolic panel     Status: None   Collection Time: 01/10/21 12:47 AM  Result Value Ref Range   Sodium 136 135 - 145 mmol/L   Potassium 3.8 3.5 - 5.1 mmol/L   Chloride 101 98 - 111 mmol/L   CO2 25 22 - 32 mmol/L   Glucose, Bld 99 70 - 99 mg/dL   BUN 14 6 - 20 mg/dL   Creatinine, Ser 5.17 0.61 - 1.24 mg/dL   Calcium 9.0 8.9 - 00.1 mg/dL   GFR, Estimated >74 >94 mL/min   Anion gap 10 5 - 15   Imaging Studies: CT ABDOMEN PELVIS W CONTRAST  Result Date: 01/10/2021 CLINICAL DATA:  Abdominal pain EXAM: CT ABDOMEN AND PELVIS WITH CONTRAST TECHNIQUE: Multidetector CT imaging of the abdomen and pelvis was performed using the standard protocol following bolus administration of intravenous contrast. CONTRAST:  OMNIPAQUE IOHEXOL 300 MG/ML  SOLN COMPARISON:  None. FINDINGS: Lower chest: Lung bases are clear. No effusions. Heart is normal size. Hepatobiliary: No focal hepatic abnormality. Gallbladder unremarkable. Pancreas: No focal abnormality or ductal dilatation. Spleen: No focal abnormality.  Normal size. Adrenals/Urinary Tract: No adrenal abnormality. No focal renal abnormality. No stones or hydronephrosis. Urinary bladder is unremarkable. Stomach/Bowel: Normal appendix. Stomach, large and small bowel grossly unremarkable. Vascular/Lymphatic: No evidence of aneurysm or adenopathy. Reproductive: No visible focal abnormality. Other: No free fluid or free air.  No visible ventral wall hernia. Musculoskeletal: No acute bony abnormality. IMPRESSION: No acute findings in the abdomen or pelvis. Electronically Signed   By: Charlett Nose M.D.   On:  01/10/2021 01:42    ED COURSE and MDM  Nursing notes, initial and subsequent vitals signs, including pulse oximetry, reviewed and interpreted by myself.  Vitals:   01/09/21 2245 01/09/21 2245 01/09/21 2354 01/10/21 0102  BP:  (!) 146/100 (!) 138/92 135/84  Pulse:  90 (!) 101 85  Resp:  16 18 16   Temp:  98.4 F (36.9 C)    TempSrc:  Oral    SpO2:  99% 98% 100%  Weight: 76.2 kg     Height: 5\' 7"  (1.702 m)  Medications  iohexol (OMNIPAQUE) 300 MG/ML solution 100 mL (100 mLs Intravenous Contrast Given 01/10/21 0124)   Patient advised of CT scan findings.  I suspect abdominal wall strain due to heavy lifting at work.  He was brought from work by his employer.  He was advised to go on light duty for about a week to allow his abdominal wall to heal.   PROCEDURES  Procedures   ED DIAGNOSES     ICD-10-CM   1. Strain of abdominal wall, initial encounter  S39.011A        Magdaleno Lortie, Jonny Ruiz, MD 01/10/21 [phone]

## 2021-08-15 ENCOUNTER — Emergency Department
Admission: EM | Admit: 2021-08-15 | Discharge: 2021-08-15 | Disposition: A | Discharge status: Home / Self Care | Payer: Medicaid Other | Source: Home / Self Care

## 2021-08-15 ENCOUNTER — Other Ambulatory Visit: Payer: Self-pay

## 2021-08-15 ENCOUNTER — Encounter: Payer: Self-pay | Admitting: Emergency Medicine

## 2021-08-15 DIAGNOSIS — R197 Diarrhea, unspecified: Secondary | ICD-10-CM

## 2021-08-15 DIAGNOSIS — R112 Nausea with vomiting, unspecified: Secondary | ICD-10-CM

## 2021-08-15 DIAGNOSIS — R1084 Generalized abdominal pain: Secondary | ICD-10-CM

## 2021-08-15 MED ORDER — LOPERAMIDE HCL 2 MG PO CAPS: 2.0000 mg | ORAL_CAPSULE | Freq: Four times a day (QID) | ORAL | 0 refills | Status: DC | PRN

## 2021-08-15 MED ORDER — ONDANSETRON HCL 8 MG PO TABS: 8.0000 mg | ORAL_TABLET | Freq: Three times a day (TID) | ORAL | 0 refills | Status: DC | PRN

## 2021-08-15 NOTE — ED Triage Notes (Signed)
Emesis  and diarrhea x 3 days after eating Steak and potatoes. No vaccinations

## 2021-08-15 NOTE — Discharge Instructions (Signed)
Make sure you continue to drink lots of water and liquids Take loperamide (Imodium) for the cramping and diarrhea.  Start by taking 1 pill.  You may repeat and take a second pill if you continue to have cramping and diarrhea.  Be careful taking more than 2 or 3 a day Take Zofran as needed for nausea.  This will stop vomiting.  This makes it so you can continue to drink lots of water After you keep down water and fluids you can eat a bland diet Avoid fried food and spicy foods for couple days

## 2021-08-15 NOTE — ED Provider Notes (Signed)
[name]    CSN: [id number] Arrival date & time: 08/15/21  1703      History   Chief Complaint Chief Complaint  Patient presents with   Emesis    HPI Frank Crawford is a 25 y.o. male.   HPI  Nausea vomiting diarrhea for 3 days.  He thinks it was from steak and potatoes.  No one else got sick.  No recent antibiotics.  No exposure to stomach flu.  He states that he is having a lot of cramping with the diarrhea.  No blood or mucus in the bowels.  No fever or chills.  Past Medical History:  Diagnosis Date   ADHD (attention deficit hyperactivity disorder)     There are no problems to display for this patient.   Past Surgical History:  Procedure Laterality Date   HERNIA REPAIR         Home Medications    Prior to Admission medications   Medication Sig Start Date End Date Taking? Authorizing Provider  loperamide (IMODIUM) 2 MG capsule Take 1 capsule (2 mg total) by mouth 4 (four) times daily as needed for diarrhea or loose stools. 08/15/21  Yes Eustace Moore, MD  ondansetron (ZOFRAN) 8 MG tablet Take 1 tablet (8 mg total) by mouth every 8 (eight) hours as needed for nausea or vomiting. 08/15/21  Yes Eustace Moore, MD    Family History Family History  Problem Relation Age of Onset   Diabetes Other    Seizures Other    Seizures Brother    Diabetes Mother    Hypertension Father     Social History Social History   Tobacco Use   Smoking status: Never   Smokeless tobacco: Never  Vaping Use   Vaping Use: Every day  Substance Use Topics   Alcohol use: Yes    Alcohol/week: 2.0 standard drinks    Types: 2 Shots of liquor per week    Comment: occasional    Drug use: No     Allergies   Apple   Review of Systems Review of Systems See HPI  Physical Exam Triage Vital Signs ED Triage Vitals  Enc Vitals Group     BP 08/15/21 1717 129/88     Pulse Rate 08/15/21 1717 (!) 102     Resp 08/15/21 1717 18     Temp 08/15/21 1717 98.2  F (36.8 C)     Temp Source 08/15/21 1717 Oral     SpO2 08/15/21 1717 96 %     Weight 08/15/21 1718 169 lb (76.7 kg)     Height 08/15/21 1718 5\' 7"  (1.702 m)     Head Circumference --      Peak Flow --      Pain Score 08/15/21 1718 8     Pain Loc --      Pain Edu? --      Excl. in GC? --    No data found.  Updated Vital Signs BP 129/88 (BP Location: Left Arm)   Pulse (!) 102   Temp 98.2 F (36.8 C) (Oral)   Resp 18   Ht 5\' 7"  (1.702 m)   Wt 76.7 kg   SpO2 96%   BMI 26.47 kg/m      Physical Exam Vitals reviewed.  Constitutional:      General: He is not in acute distress.    Appearance: He is well-developed.  HENT:     Head: Normocephalic and atraumatic.     Mouth/Throat:  Mouth: Mucous membranes are moist.     Pharynx: No posterior oropharyngeal erythema.     Comments: Does not appear dehydrated Eyes:     Conjunctiva/sclera: Conjunctivae normal.     Pupils: Pupils are equal, round, and reactive to light.  Cardiovascular:     Rate and Rhythm: Normal rate and regular rhythm.     Heart sounds: Normal heart sounds.  Pulmonary:     Effort: Pulmonary effort is normal. No respiratory distress.     Breath sounds: Normal breath sounds.  Abdominal:     General: There is no distension.     Palpations: Abdomen is soft.     Tenderness: There is no abdominal tenderness. There is no guarding or rebound.     Comments: Bowel sounds are active.  Minimal tenderness to deep palpation of both lower quadrants.  No guarding or rebound.  No mass organomegaly  Musculoskeletal:        General: Normal range of motion.     Cervical back: Normal range of motion and neck supple.  Skin:    General: Skin is warm and dry.  Neurological:     Mental Status: He is alert.  Psychiatric:        Mood and Affect: Mood normal.        Behavior: Behavior normal.     UC Treatments / Results  Labs (all labs ordered are listed, but only abnormal results are displayed) Labs Reviewed - No data  to display  EKG   Radiology No results found.  Procedures Procedures (including critical care time)  Medications Ordered in UC Medications - No data to display  Initial Impression / Assessment and Plan / UC Course  I have reviewed the triage vital signs and the nursing notes.  Pertinent labs & imaging results that were available during my care of the patient were reviewed by me and considered in my medical decision making (see chart for details).     Reviewed the importance of Avoiding dehydration.  Discussed resuming regular diet.  Return as needed Final Clinical Impressions(s) / UC Diagnoses   Final diagnoses:  Nausea vomiting and diarrhea  Generalized abdominal pain     Discharge Instructions      Make sure you continue to drink lots of water and liquids Take loperamide (Imodium) for the cramping and diarrhea.  Start by taking 1 pill.  You may repeat and take a second pill if you continue to have cramping and diarrhea.  Be careful taking more than 2 or 3 a day Take Zofran as needed for nausea.  This will stop vomiting.  This makes it so you can continue to drink lots of water After you keep down water and fluids you can eat a bland diet Avoid fried food and spicy foods for couple days   ED Prescriptions     Medication Sig Dispense Auth. Provider   loperamide (IMODIUM) 2 MG capsule Take 1 capsule (2 mg total) by mouth 4 (four) times daily as needed for diarrhea or loose stools. 12 capsule Eustace Moore, MD   ondansetron (ZOFRAN) 8 MG tablet Take 1 tablet (8 mg total) by mouth every 8 (eight) hours as needed for nausea or vomiting. 20 tablet Eustace Moore, MD      PDMP not reviewed this encounter.   Eustace Moore, MD 08/15/21 [phone]

## 2021-08-18 ENCOUNTER — Telehealth: Payer: Self-pay | Admitting: Emergency Medicine

## 2021-08-18 NOTE — Telephone Encounter (Signed)
Call from [place] to see if  his work note could be extended because he does not feel better. Work note extended for pt to return on Sunday 08/20/21- sent via My Chart

## 2021-08-23 ENCOUNTER — Other Ambulatory Visit: Payer: Self-pay

## 2021-08-23 ENCOUNTER — Emergency Department (INDEPENDENT_AMBULATORY_CARE_PROVIDER_SITE_OTHER)
Admission: EM | Admit: 2021-08-23 | Discharge: 2021-08-23 | Disposition: A | Discharge status: Home / Self Care | Payer: Medicaid Other | Source: Home / Self Care | Attending: Family Medicine | Admitting: Family Medicine

## 2021-08-23 ENCOUNTER — Other Ambulatory Visit (HOSPITAL_COMMUNITY)
Admission: RE | Admit: 2021-08-23 | Discharge: 2021-08-23 | Disposition: A | Discharge status: Home / Self Care | Payer: Medicaid Other | Source: Ambulatory Visit | Attending: Family Medicine | Admitting: Family Medicine

## 2021-08-23 ENCOUNTER — Encounter: Payer: Self-pay | Admitting: Emergency Medicine

## 2021-08-23 DIAGNOSIS — Z113 Encounter for screening for infections with a predominantly sexual mode of transmission: Secondary | ICD-10-CM | POA: Diagnosis present

## 2021-08-23 DIAGNOSIS — Z7251 High risk heterosexual behavior: Secondary | ICD-10-CM | POA: Diagnosis not present

## 2021-08-23 DIAGNOSIS — Z0189 Encounter for other specified special examinations: Secondary | ICD-10-CM

## 2021-08-23 NOTE — ED Provider Notes (Signed)
[name]    CSN: [id number] Arrival date & time: 08/23/21  1138      History   Chief Complaint Chief Complaint  Patient presents with   SEXUALLY TRANSMITTED DISEASE    HPI Frank Crawford is a 25 y.o. male.   HPI  Patient is here for STD testing.  No symptoms. He has had unprotected sexual relations.  Past Medical History:  Diagnosis Date   ADHD (attention deficit hyperactivity disorder)     There are no problems to display for this patient.   Past Surgical History:  Procedure Laterality Date   HERNIA REPAIR         Home Medications    Prior to Admission medications   Medication Sig Start Date End Date Taking? Authorizing Provider  loperamide (IMODIUM) 2 MG capsule Take 1 capsule (2 mg total) by mouth 4 (four) times daily as needed for diarrhea or loose stools. 08/15/21   Eustace Moore, MD  ondansetron (ZOFRAN) 8 MG tablet Take 1 tablet (8 mg total) by mouth every 8 (eight) hours as needed for nausea or vomiting. 08/15/21   Eustace Moore, MD    Family History Family History  Problem Relation Age of Onset   Diabetes Other    Seizures Other    Seizures Brother    Diabetes Mother    Hypertension Father     Social History Social History   Tobacco Use   Smoking status: Never   Smokeless tobacco: Never  Vaping Use   Vaping Use: Every day  Substance Use Topics   Alcohol use: Yes    Alcohol/week: 2.0 standard drinks    Types: 2 Shots of liquor per week    Comment: occasional    Drug use: No     Allergies   Apple   Review of Systems Review of Systems See HPI  Physical Exam Triage Vital Signs ED Triage Vitals  Enc Vitals Group     BP 08/23/21 1227 118/73     Pulse Rate 08/23/21 1227 66     Resp 08/23/21 1227 16     Temp 08/23/21 1227 98.6 F (37 C)     Temp Source 08/23/21 1227 Oral     SpO2 08/23/21 1227 99 %     Weight 08/23/21 1229 169 lb (76.7 kg)     Height 08/23/21 1229 5\' 7"  (1.702 m)     Head  Circumference --      Peak Flow --      Pain Score 08/23/21 1229 0     Pain Loc --      Pain Edu? --      Excl. in GC? --    No data found.  Updated Vital Signs BP 118/73 (BP Location: Left Arm)   Pulse 66   Temp 98.6 F (37 C) (Oral)   Resp 16   Ht 5\' 7"  (1.702 m)   Wt 76.7 kg   SpO2 99%   BMI 26.47 kg/m      Physical Exam Constitutional:      General: He is not in acute distress.    Appearance: Normal appearance. He is well-developed.     Comments: Examination by observation  HENT:     Head: Normocephalic and atraumatic.  Eyes:     Conjunctiva/sclera: Conjunctivae normal.     Pupils: Pupils are equal, round, and reactive to light.  Cardiovascular:     Rate and Rhythm: Normal rate.  Pulmonary:     Effort: Pulmonary  effort is normal. No respiratory distress.  Abdominal:     General: There is no distension.     Palpations: Abdomen is soft.  Musculoskeletal:        General: Normal range of motion.     Cervical back: Normal range of motion.  Skin:    General: Skin is warm and dry.  Neurological:     Mental Status: He is alert.     UC Treatments / Results  Labs (all labs ordered are listed, but only abnormal results are displayed) Labs Reviewed  RPR  HIV ANTIBODY (ROUTINE TESTING W REFLEX)  CYTOLOGY, (ORAL, ANAL, URETHRAL) ANCILLARY ONLY    EKG   Radiology No results found.  Procedures Procedures (including critical care time)  Medications Ordered in UC Medications - No data to display  Initial Impression / Assessment and Plan / UC Course  I have reviewed the triage vital signs and the nursing notes.  Pertinent labs & imaging results that were available during my care of the patient were reviewed by me and considered in my medical decision making (see chart for details).      Final Clinical Impressions(s) / UC Diagnoses   Final diagnoses:  Encounter for laboratory test  History of unprotected sex     Discharge Instructions       Check for your test results on MyChart You will be called if any of your tests are positive   ED Prescriptions   None    PDMP not reviewed this encounter.   Eustace Moore, MD 08/23/21 1329

## 2021-08-23 NOTE — Discharge Instructions (Signed)
Check for your test results on MyChart You will be called if any of your tests are positive

## 2021-08-23 NOTE — ED Triage Notes (Signed)
Pt here for STD testing  Pt identifies as gay - same sex partner

## 2021-08-24 LAB — CYTOLOGY, (ORAL, ANAL, URETHRAL) ANCILLARY ONLY
Chlamydia: POSITIVE — AB
Comment: NEGATIVE
Comment: NEGATIVE
Comment: NORMAL
Neisseria Gonorrhea: NEGATIVE
Trichomonas: NEGATIVE

## 2021-08-24 LAB — HIV ANTIBODY (ROUTINE TESTING W REFLEX): HIV 1&2 Ab, 4th Generation: NONREACTIVE

## 2021-08-24 LAB — RPR: RPR Ser Ql: NONREACTIVE

## 2021-08-25 ENCOUNTER — Telehealth (HOSPITAL_COMMUNITY): Payer: Self-pay | Admitting: Emergency Medicine

## 2021-08-25 MED ORDER — DOXYCYCLINE HYCLATE 100 MG PO CAPS: 100.0000 mg | ORAL_CAPSULE | Freq: Two times a day (BID) | ORAL | 0 refills | Status: AC

## 2021-10-13 ENCOUNTER — Ambulatory Visit: Payer: Medicaid Other | Admitting: Medical-Surgical

## 2022-01-31 ENCOUNTER — Emergency Department (INDEPENDENT_AMBULATORY_CARE_PROVIDER_SITE_OTHER)
Admission: EM | Admit: 2022-01-31 | Discharge: 2022-01-31 | Disposition: A | Discharge status: Home / Self Care | Payer: Medicaid Other | Source: Home / Self Care

## 2022-01-31 ENCOUNTER — Encounter: Payer: Self-pay | Admitting: *Deleted

## 2022-01-31 ENCOUNTER — Other Ambulatory Visit: Payer: Self-pay

## 2022-01-31 DIAGNOSIS — Z202 Contact with and (suspected) exposure to infections with a predominantly sexual mode of transmission: Secondary | ICD-10-CM

## 2022-01-31 MED ORDER — CEFTRIAXONE SODIUM 500 MG IJ SOLR
500.0000 mg | Freq: Once | INTRAMUSCULAR | Status: AC
  Administered 2022-01-31: 500 mg via INTRAMUSCULAR

## 2022-01-31 NOTE — ED Triage Notes (Signed)
Pt reports boyfriend is positive for Gonorrhea. He currently denies s/s.   ?

## 2022-01-31 NOTE — ED Provider Notes (Signed)
?[facility] ? ? ? ?CSN: [id number] ?Arrival date & time: 01/31/22  0945 ? ? ?  ? ?History   ?Chief Complaint ?Chief Complaint  ?Patient presents with  ? SEXUALLY TRANSMITTED DISEASE  ? ? ?HPI ?Frank Crawford is a 26 y.o. male.  ? ?HPI 26 year-old male presents with concern for gonorrhea.  Reports that his boyfriend currently tested positive for gonorrhea. ? ?Past Medical History:  ?Diagnosis Date  ? ADHD (attention deficit hyperactivity disorder)   ? ? ?There are no problems to display for this patient. ? ? ?Past Surgical History:  ?Procedure Laterality Date  ? HERNIA REPAIR    ? ? ? ? ? ?Home Medications   ? ?Prior to Admission medications   ?Not on File  ? ? ?Family History ?Family History  ?Problem Relation Age of Onset  ? Diabetes Other   ? Seizures Other   ? Seizures Brother   ? Diabetes Mother   ? Hypertension Father   ? ? ?Social History ?Social History  ? ?Tobacco Use  ? Smoking status: Never  ? Smokeless tobacco: Never  ?Vaping Use  ? Vaping Use: Every day  ?Substance Use Topics  ? Alcohol use: Yes  ?  Alcohol/week: 2.0 standard drinks  ?  Types: 2 Shots of liquor per week  ?  Comment: occasional   ? Drug use: No  ? ? ? ?Allergies   ?Apple juice ? ? ?Review of Systems ?Review of Systems  ?All other systems reviewed and are negative. ? ? ?Physical Exam ?Triage Vital Signs ?ED Triage Vitals  ?Enc Vitals Group  ?   BP 01/31/22 1001 (!) 141/101  ?   Pulse Rate 01/31/22 1001 78  ?   Resp 01/31/22 1001 16  ?   Temp 01/31/22 1001 97.9 ?F (36.6 ?C)  ?   Temp Source 01/31/22 1001 Oral  ?   SpO2 01/31/22 1001 99 %  ?   Weight 01/31/22 0959 162 lb (73.5 kg)  ?   Height 01/31/22 0959 5\' 7"  (1.702 m)  ?   Head Circumference --   ?   Peak Flow --   ?   Pain Score 01/31/22 0959 0  ?   Pain Loc --   ?   Pain Edu? --   ?   Excl. in GC? --   ? ?No data found. ? ?Updated Vital Signs ?BP (!) 141/101 (BP Location: Right Arm)   Pulse 78   Temp 97.9 ?F (36.6 ?C) (Oral)   Resp 16   Ht 5\' 7"  (1.702 m)   Wt 162 lb  (73.5 kg)   SpO2 99%   BMI 25.37 kg/m?  ? ? ?Physical Exam ?Vitals and nursing note reviewed.  ?Constitutional:   ?   General: He is not in acute distress. ?   Appearance: Normal appearance. He is normal weight. He is not ill-appearing.  ?HENT:  ?   Head: Normocephalic and atraumatic.  ?   Mouth/Throat:  ?   Mouth: Mucous membranes are moist.  ?   Pharynx: Oropharynx is clear.  ?Eyes:  ?   Extraocular Movements: Extraocular movements intact.  ?   Conjunctiva/sclera: Conjunctivae normal.  ?   Pupils: Pupils are equal, round, and reactive to light.  ?Cardiovascular:  ?   Rate and Rhythm: Normal rate and regular rhythm.  ?   Pulses: Normal pulses.  ?Pulmonary:  ?   Effort: Pulmonary effort is normal.  ?   Breath sounds: Normal breath sounds. No  wheezing, rhonchi or rales.  ?Musculoskeletal:  ?   Cervical back: Normal range of motion and neck supple.  ?Skin: ?   General: Skin is warm and dry.  ?Neurological:  ?   General: No focal deficit present.  ?   Mental Status: He is alert and oriented to person, place, and time.  ? ? ? ?UC Treatments / Results  ?Labs ?(all labs ordered are listed, but only abnormal results are displayed) ?Labs Reviewed  ?CYTOLOGY, (ORAL, ANAL, URETHRAL) ANCILLARY ONLY  ? ? ?EKG ? ? ?Radiology ?No results found. ? ?Procedures ?Procedures (including critical care time) ? ?Medications Ordered in UC ?Medications  ?cefTRIAXone (ROCEPHIN) injection 500 mg (500 mg Intramuscular Given 01/31/22 1023)  ? ? ?Initial Impression / Assessment and Plan / UC Course  ?I have reviewed the triage vital signs and the nursing notes. ? ?Pertinent labs & imaging results that were available during my care of the patient were reviewed by me and considered in my medical decision making (see chart for details). ? ?  ? ?MDM: 1.  Potential exposure to STD-IM Rocephin 500 mg given once in clinic prior to discharge, Aptima swab ordered-Advised patient we will follow-up with lab results once received.  Patient discharged home,  hemodynamically stable. ?Final Clinical Impressions(s) / UC Diagnoses  ? ?Final diagnoses:  ?Potential exposure to STD  ? ? ? ?Discharge Instructions   ? ?  ?Advised patient we will follow-up with lab results once received. ? ? ? ? ?ED Prescriptions   ?None ?  ? ?PDMP not reviewed this encounter. ?  ?Trevor Iha, FNP ?01/31/22 1300 ? ?

## 2022-01-31 NOTE — Discharge Instructions (Addendum)
Advised patient we will follow-up with lab results once received. 

## 2022-02-01 LAB — CYTOLOGY, (ORAL, ANAL, URETHRAL) ANCILLARY ONLY
Chlamydia: NEGATIVE
Comment: NEGATIVE
Comment: NEGATIVE
Comment: NORMAL
Neisseria Gonorrhea: NEGATIVE
Trichomonas: NEGATIVE

## 2022-05-26 ENCOUNTER — Ambulatory Visit (INDEPENDENT_AMBULATORY_CARE_PROVIDER_SITE_OTHER): Payer: Medicaid Other

## 2022-05-26 ENCOUNTER — Ambulatory Visit
Admission: EM | Admit: 2022-05-26 | Discharge: 2022-05-26 | Disposition: A | Discharge status: Home / Self Care | Payer: Medicaid Other | Attending: Family Medicine | Admitting: Family Medicine

## 2022-05-26 ENCOUNTER — Encounter: Payer: Self-pay | Admitting: Emergency Medicine

## 2022-05-26 DIAGNOSIS — R030 Elevated blood-pressure reading, without diagnosis of hypertension: Secondary | ICD-10-CM

## 2022-05-26 DIAGNOSIS — S6992XA Unspecified injury of left wrist, hand and finger(s), initial encounter: Secondary | ICD-10-CM

## 2022-05-26 DIAGNOSIS — M25532 Pain in left wrist: Secondary | ICD-10-CM | POA: Diagnosis not present

## 2022-05-26 DIAGNOSIS — S60212A Contusion of left wrist, initial encounter: Secondary | ICD-10-CM | POA: Diagnosis not present

## 2022-05-26 MED ORDER — IBUPROFEN 800 MG PO TABS: 800.0000 mg | ORAL_TABLET | Freq: Three times a day (TID) | ORAL | 0 refills | Status: AC

## 2022-05-26 NOTE — Discharge Instructions (Addendum)
May use ice to reduce pain and swelling.  20 minutes every 2-4 hours Take ibuprofen 3 times a day with food.  Take this as needed for pain Wear brace as long as wrist is painful Limit heavy use of arm See Worker's Comp. doctor in follow-up next week  You need to have your blood pressure rechecked

## 2022-05-26 NOTE — ED Triage Notes (Signed)
Patient c/o left wrist injury yesterday at work.  He was hit with the cooler door yesterday.  Unable to lift anything with wrist.  Denies any OTC pain meds.

## 2022-05-26 NOTE — ED Provider Notes (Signed)
[name]    CSN: [id number] Arrival date & time: 05/26/22  1509      History   Chief Complaint Chief Complaint  Patient presents with   Wrist Pain    HPI Frank Crawford is a 26 y.o. male.   HPI  Patient works at a [employer].  He is the Production designer, theatre/television/film.  He states that a large walk-in freezer door slammed on his arm yesterday.  Is very painful.  He has pain with lifting his arm.  Prior fracture or injury  Past Medical History:  Diagnosis Date   ADHD (attention deficit hyperactivity disorder)     There are no problems to display for this patient.   Past Surgical History:  Procedure Laterality Date   HERNIA REPAIR         Home Medications    Prior to Admission medications   Medication Sig Start Date End Date Taking? Authorizing Provider  ibuprofen (ADVIL) 800 MG tablet Take 1 tablet (800 mg total) by mouth 3 (three) times daily. 05/26/22  Yes Eustace Moore, MD    Family History Family History  Problem Relation Age of Onset   Diabetes Other    Seizures Other    Seizures Brother    Diabetes Mother    Hypertension Father     Social History Social History   Tobacco Use   Smoking status: Never   Smokeless tobacco: Current  Vaping Use   Vaping Use: Every day  Substance Use Topics   Alcohol use: Yes    Alcohol/week: 2.0 standard drinks of alcohol    Types: 2 Shots of liquor per week    Comment: occasional    Drug use: No     Allergies   Apple juice   Review of Systems Review of Systems   Physical Exam Triage Vital Signs ED Triage Vitals  Enc Vitals Group     BP 05/26/22 1532 (!) 153/104     Pulse Rate 05/26/22 1532 60     Resp 05/26/22 1532 18     Temp 05/26/22 1532 98.8 F (37.1 C)     Temp Source 05/26/22 1532 Oral     SpO2 05/26/22 1532 96 %     Weight 05/26/22 1534 172 lb (78 kg)     Height 05/26/22 1534 5\' 7"  (1.702 m)     Head Circumference --      Peak Flow --      Pain Score 05/26/22 1533 8     Pain  Loc --      Pain Edu? --      Excl. in [place]? --    No data found.       Physical Exam Constitutional:      General: He is not in acute distress.    Appearance: He is well-developed.  HENT:     Head: Normocephalic and atraumatic.  Eyes:     Conjunctiva/sclera: Conjunctivae normal.     Pupils: Pupils are equal, round, and reactive to light.  Cardiovascular:     Rate and Rhythm: Normal rate.  Pulmonary:     Effort: Pulmonary effort is normal. No respiratory distress.  Abdominal:     General: There is no distension.     Palpations: Abdomen is soft.  Musculoskeletal:        General: Normal range of motion.       Arms:     Cervical back: Normal range of motion.  Skin:    General: Skin is  warm and dry.  Neurological:     General: No focal deficit present.     Mental Status: He is alert.     Gait: Gait normal.  Psychiatric:        Mood and Affect: Mood normal.        Behavior: Behavior normal.      UC Treatments / Results  Labs (all labs ordered are listed, but only abnormal results are displayed) Labs Reviewed - No data to display  EKG   Radiology DG Wrist Complete Left  Result Date: 05/26/2022 CLINICAL DATA:  Trauma, pain EXAM: LEFT WRIST - COMPLETE 3+ VIEW COMPARISON:  Left hand radiographs done on 06/11/2014 FINDINGS: There is no evidence of fracture or dislocation. There is no evidence of arthropathy or other focal bone abnormality. Soft tissues are unremarkable. IMPRESSION: No fracture or dislocation is seen in left wrist. Electronically Signed   By: Ernie Avena M.D.   On: 05/26/2022 15:55    Procedures Procedures (including critical care time)  Medications Ordered in UC Medications - No data to display  Initial Impression / Assessment and Plan / UC Course  I have reviewed the triage vital signs and the nursing notes.  Pertinent labs & imaging results that were available during my care of the patient were reviewed by me and considered in my medical  decision making (see chart for details).     Final Clinical Impressions(s) / UC Diagnoses   Final diagnoses:  Contusion of left wrist, initial encounter  Left wrist pain  Elevated blood pressure reading without diagnosis of hypertension     Discharge Instructions      May use ice to reduce pain and swelling.  20 minutes every 2-4 hours Take ibuprofen 3 times a day with food.  Take this as needed for pain Wear brace as long as wrist is painful Limit heavy use of arm See Worker's Comp. doctor in follow-up next week  You need to have your blood pressure rechecked    ED Prescriptions     Medication Sig Dispense Auth. Provider   ibuprofen (ADVIL) 800 MG tablet Take 1 tablet (800 mg total) by mouth 3 (three) times daily. 21 tablet Eustace Moore, MD      PDMP not reviewed this encounter.   Eustace Moore, MD 05/26/22 [phone]

## 2022-06-28 ENCOUNTER — Encounter: Payer: Self-pay | Admitting: Emergency Medicine

## 2022-06-28 ENCOUNTER — Other Ambulatory Visit: Payer: Self-pay

## 2022-06-28 ENCOUNTER — Ambulatory Visit
Admission: EM | Admit: 2022-06-28 | Discharge: 2022-06-28 | Disposition: A | Discharge status: Home / Self Care | Payer: Medicaid Other

## 2022-06-28 DIAGNOSIS — R197 Diarrhea, unspecified: Secondary | ICD-10-CM

## 2022-06-28 DIAGNOSIS — K529 Noninfective gastroenteritis and colitis, unspecified: Secondary | ICD-10-CM

## 2022-06-28 NOTE — ED Provider Notes (Signed)
[facility]    CSN: [id number] Arrival date & time: 06/28/22  1402      History   Chief Complaint Chief Complaint  Patient presents with   Abdominal Pain    HPI Frank Crawford is a 26 y.o. male.   Pleasant 26 year old male presents today due to concerns of lower abdominal discomfort.  He states for the past 3 days he has had a rather constant cramping sensation to his bilateral lower abdomen.  He denies any urinary symptoms.  He states he has had 3-4 bouts of watery diarrhea daily.  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  He is uncertain if this is the cause.  He has not had any fevers.  No rash, vomiting, or nausea.  He is sexually active with another man, but denies any dysuria, rash, swelling, discharge.   Abdominal Pain Associated symptoms: diarrhea   Associated symptoms: no fever, no nausea and no vomiting     Past Medical History:  Diagnosis Date   ADHD (attention deficit hyperactivity disorder)     There are no problems to display for this patient.   Past Surgical History:  Procedure Laterality Date   HERNIA REPAIR         Home Medications    Prior to Admission medications   Medication Sig Start Date End Date Taking? Authorizing Provider  ibuprofen (ADVIL) 800 MG tablet Take 1 tablet (800 mg total) by mouth 3 (three) times daily. 05/26/22   Raylene Everts, MD    Family History Family History  Problem Relation Age of Onset   Diabetes Other    Seizures Other    Seizures Brother    Diabetes Mother    Hypertension Father     Social History Social History   Tobacco Use   Smoking status: Never   Smokeless tobacco: Former  Scientific laboratory technician Use: Former  Substance Use Topics   Alcohol use: Yes    Alcohol/week: 2.0 standard drinks of alcohol    Types: 2 Shots of liquor per week    Comment: occasional    Drug use: No     Allergies   Apple juice   Review of  Systems Review of Systems  Constitutional:  Negative for appetite change and fever.  Gastrointestinal:  Positive for abdominal pain and diarrhea. Negative for blood in stool, nausea and vomiting.  All other systems reviewed and are negative.    Physical Exam Triage Vital Signs ED Triage Vitals  Enc Vitals Group     BP 06/28/22 1423 (!) 142/89     Pulse Rate 06/28/22 1423 71     Resp 06/28/22 1423 16     Temp 06/28/22 1423 97.6 F (36.4 C)     Temp Source 06/28/22 1423 Oral     SpO2 06/28/22 1423 98 %     Weight 06/28/22 1426 167 lb (75.8 kg)     Height 06/28/22 1426 $RemoveBefor'5\' 7"'CJwVfUOAfqDF$  (1.702 m)     Head Circumference --      Peak Flow --      Pain Score 06/28/22 1424 8     Pain Loc --      Pain Edu? --      Excl. in [place]? --    No data found.  Updated Vital Signs BP (!) 142/89 (BP Location: Left Arm)   Pulse 71   Temp 97.6 F (36.4 C) (Oral)  Resp 16   Ht $R'5\' 7"'pu$  (1.702 m)   Wt 167 lb (75.8 kg)   SpO2 98%   BMI 26.16 kg/m   Visual Acuity Right Eye Distance:   Left Eye Distance:   Bilateral Distance:    Right Eye Near:   Left Eye Near:    Bilateral Near:     Physical Exam Vitals and nursing note reviewed.  Constitutional:      General: He is not in acute distress.    Appearance: He is well-developed and normal weight. He is not ill-appearing, toxic-appearing or diaphoretic.     Comments: Pt sitting upright, smiling  HENT:     Head: Normocephalic and atraumatic.     Mouth/Throat:     Mouth: Mucous membranes are moist.     Pharynx: Oropharynx is clear. No pharyngeal swelling or oropharyngeal exudate.  Eyes:     General: No scleral icterus.    Extraocular Movements: Extraocular movements intact.     Pupils: Pupils are equal, round, and reactive to light.  Cardiovascular:     Rate and Rhythm: Normal rate and regular rhythm.     Heart sounds: Normal heart sounds. No murmur heard. Pulmonary:     Effort: Pulmonary effort is normal. No respiratory distress.     Breath  sounds: Normal breath sounds. No stridor. No wheezing, rhonchi or rales.  Abdominal:     General: Bowel sounds are normal.     Palpations: Abdomen is soft. There is no fluid wave, hepatomegaly or splenomegaly.     Tenderness: There is abdominal tenderness (mild reproducible tenderness without signs of rebound, rigidity or guarding) in the right lower quadrant and left lower quadrant. There is no right CVA tenderness, left CVA tenderness, guarding or rebound. Negative signs include Murphy's sign, Rovsing's sign, McBurney's sign, psoas sign and obturator sign.     Hernia: No hernia is present.  Genitourinary:    Comments: deferred Skin:    General: Skin is warm and dry.     Capillary Refill: Capillary refill takes less than 2 seconds.     Coloration: Skin is not cyanotic or pale.     Findings: No rash.  Neurological:     General: No focal deficit present.     Mental Status: He is alert and oriented to person, place, and time.      UC Treatments / Results  Labs (all labs ordered are listed, but only abnormal results are displayed) Labs Reviewed  GASTROINTESTINAL PANEL BY PCR, STOOL (REPLACES STOOL CULTURE)    EKG   Radiology No results found.  Procedures Procedures (including critical care time)  Medications Ordered in UC Medications - No data to display  Initial Impression / Assessment and Plan / UC Course  I have reviewed the triage vital signs and the nursing notes.  Pertinent labs & imaging results that were available during my care of the patient were reviewed by me and considered in my medical decision making (see chart for details).     Diarrhea - pt newly eating spicy chicken on a frequent basis. Question his food choice to be the cause. No physical exam findings to warrant advanced imaging. Pt sent home with GI stool culture kit for further testing. Pt to bring back sample tomorrow once completed. Will start tx only if positive for bacterial cause. Out of work  while labs processing to ensure this is not fecal/oral spread.  Gastroenteritis - supportive care for now. Food choices handout provided. Pt's vital stable, no indication  for dehydration on exam.    Final Clinical Impressions(s) / UC Diagnoses   Final diagnoses:  Diarrhea, unspecified type  Gastroenteritis     Discharge Instructions      Your symptoms sound secondary to gastroenteritis, which is inflammation and irritation of the colon. Please perform the stool culture as soon as possible and bring back for evaluation. Please read the food choices to help relieve diarrhea handout attached to this form. Please stop eating the spicy chicken.  Please start eating bland foods such as plain boiled chicken, unseasoned rice, dry toast.  This might help bulk in the stool.  Please stay hydrated with gatorade and water.      ED Prescriptions   None    PDMP not reviewed this encounter.   Chaney Malling, [place] 06/28/22 1510

## 2022-06-28 NOTE — ED Triage Notes (Addendum)
Lower abdominal pain, loose stools x 3 days, cramping. Fatigue  Denies urinary problems, denies unprotected sex. Yesterday 4 x's Has been eating spicy chicken for 5 straight days.

## 2022-06-28 NOTE — Discharge Instructions (Addendum)
Your symptoms sound secondary to gastroenteritis, which is inflammation and irritation of the colon. Please perform the stool culture as soon as possible and bring back for evaluation. Please read the food choices to help relieve diarrhea handout attached to this form. Please stop eating the spicy chicken.  Please start eating bland foods such as plain boiled chicken, unseasoned rice, dry toast.  This might help bulk in the stool.  Please stay hydrated with gatorade and water.

## 2022-10-01 ENCOUNTER — Ambulatory Visit
Admission: EM | Admit: 2022-10-01 | Discharge: 2022-10-01 | Disposition: A | Discharge status: Home / Self Care | Payer: Medicaid Other | Attending: Family Medicine | Admitting: Family Medicine

## 2022-10-01 DIAGNOSIS — R059 Cough, unspecified: Secondary | ICD-10-CM

## 2022-10-01 DIAGNOSIS — J101 Influenza due to other identified influenza virus with other respiratory manifestations: Secondary | ICD-10-CM

## 2022-10-01 LAB — POCT INFLUENZA A/B
Influenza A, POC: NEGATIVE
Influenza B, POC: POSITIVE — AB

## 2022-10-01 LAB — POC SARS CORONAVIRUS 2 AG -  ED: SARS Coronavirus 2 Ag: NEGATIVE

## 2022-10-01 MED ORDER — OSELTAMIVIR PHOSPHATE 75 MG PO CAPS: 75.0000 mg | ORAL_CAPSULE | Freq: Two times a day (BID) | ORAL | 0 refills | Status: DC

## 2022-10-01 MED ORDER — BENZONATATE 200 MG PO CAPS: 200.0000 mg | ORAL_CAPSULE | Freq: Three times a day (TID) | ORAL | 0 refills | Status: AC | PRN

## 2022-10-01 NOTE — Discharge Instructions (Addendum)
Advised patient to take medication as directed with food to completion.  Advised may take Tessalon daily or as needed for cough.  Encouraged patient to increase daily water intake to 64 ounces per day while taking these medications.  Advised if symptoms worsen and/or unresolved please follow-up with PCP or here for further evaluation.

## 2022-10-01 NOTE — ED Triage Notes (Signed)
Pt c.o headache, chills and cough x 2 days. Taking ibuprofen prn.

## 2022-10-01 NOTE — ED Provider Notes (Signed)
[name]    CSN: [id number] Arrival date & time: 10/01/22  1257      History   Chief Complaint Chief Complaint  Patient presents with   Headache   Chills   Cough    HPI Frank Crawford is a 27 y.o. male.   HPI 27 year old male presents with headache chills and cough for 2 days.  PMH significant for ADHD  Past Medical History:  Diagnosis Date   ADHD (attention deficit hyperactivity disorder)     There are no problems to display for this patient.   Past Surgical History:  Procedure Laterality Date   HERNIA REPAIR         Home Medications    Prior to Admission medications   Medication Sig Start Date End Date Taking? Authorizing Provider  benzonatate (TESSALON) 200 MG capsule Take 1 capsule (200 mg total) by mouth 3 (three) times daily as needed for up to 7 days. 10/01/22 10/08/22 Yes Trevor Iha, FNP  oseltamivir (TAMIFLU) 75 MG capsule Take 1 capsule (75 mg total) by mouth every 12 (twelve) hours. 10/01/22  Yes Trevor Iha, FNP  ibuprofen (ADVIL) 800 MG tablet Take 1 tablet (800 mg total) by mouth 3 (three) times daily. 05/26/22   Eustace Moore, MD    Family History Family History  Problem Relation Age of Onset   Diabetes Other    Seizures Other    Seizures Brother    Diabetes Mother    Hypertension Father     Social History Social History   Tobacco Use   Smoking status: Never   Smokeless tobacco: Former  Building services engineer Use: Former  Substance Use Topics   Alcohol use: Yes    Alcohol/week: 2.0 standard drinks of alcohol    Types: 2 Shots of liquor per week    Comment: occasional    Drug use: No     Allergies   Apple juice   Review of Systems Review of Systems  Constitutional:  Positive for chills, fatigue and fever.  HENT:  Positive for congestion.   Respiratory:  Positive for cough.   All other systems reviewed and are negative.    Physical Exam Triage Vital Signs ED Triage Vitals  Enc Vitals Group     BP  10/01/22 1307 (!) 144/87     Pulse Rate 10/01/22 1307 91     Resp 10/01/22 1307 18     Temp 10/01/22 1307 97.8 F (36.6 C)     Temp Source 10/01/22 1307 Oral     SpO2 10/01/22 1307 98 %     Weight --      Height --      Head Circumference --      Peak Flow --      Pain Score 10/01/22 1308 0     Pain Loc --      Pain Edu? --      Excl. in [place]? --    No data found.  Updated Vital Signs BP (!) 144/87 (BP Location: Right Arm)   Pulse 91   Temp 97.8 F (36.6 C) (Oral)   Resp 18   SpO2 98%   Visual Acuity Right Eye Distance:   Left Eye Distance:   Bilateral Distance:    Right Eye Near:   Left Eye Near:    Bilateral Near:     Physical Exam Vitals and nursing note reviewed.  Constitutional:      Appearance: Normal appearance. He is normal weight.  He is ill-appearing.  HENT:     Head: Normocephalic and atraumatic.     Right Ear: Tympanic membrane, ear canal and external ear normal.     Left Ear: Tympanic membrane, ear canal and external ear normal.     Nose: Nose normal.     Mouth/Throat:     Mouth: Mucous membranes are moist.     Pharynx: Oropharynx is clear.  Eyes:     Extraocular Movements: Extraocular movements intact.     Conjunctiva/sclera: Conjunctivae normal.     Pupils: Pupils are equal, round, and reactive to light.  Cardiovascular:     Rate and Rhythm: Normal rate and regular rhythm.     Pulses: Normal pulses.     Heart sounds: Normal heart sounds.  Pulmonary:     Effort: Pulmonary effort is normal.     Breath sounds: Normal breath sounds. No wheezing or rhonchi.     Comments: Frequent nonproductive cough noted on exam Musculoskeletal:        General: Normal range of motion.     Cervical back: Normal range of motion and neck supple.  Skin:    General: Skin is warm and dry.  Neurological:     General: No focal deficit present.     Mental Status: He is alert and oriented to person, place, and time.      UC Treatments / Results  Labs (all labs  ordered are listed, but only abnormal results are displayed) Labs Reviewed  POCT INFLUENZA A/B - Abnormal; Notable for the following components:      Result Value   Influenza B, POC Positive (*)    All other components within normal limits  POC SARS CORONAVIRUS 2 AG -  ED    EKG   Radiology No results found.  Procedures Procedures (including critical care time)  Medications Ordered in UC Medications - No data to display  Initial Impression / Assessment and Plan / UC Course  I have reviewed the triage vital signs and the nursing notes.  Pertinent labs & imaging results that were available during my care of the patient were reviewed by me and considered in my medical decision making (see chart for details).     MDM: 1. Influenza B- Rx'd Tamiflu; 2.  Cough-Rx Tessalon [place]. Advised patient to take medication as directed with food to completion.  Advised may take Tessalon daily or as needed for cough.  Encouraged patient to increase daily water intake to 64 ounces per day while taking these medications.  Advised if symptoms worsen and/or unresolved please follow-up with PCP or here for further evaluation.  Work note provided to patient prior to discharge.  Patient discharged home, hemodynamically stable. Final Clinical Impressions(s) / UC Diagnoses   Final diagnoses:  Cough, unspecified type  Influenza B     Discharge Instructions      Advised patient to take medication as directed with food to completion.  Advised may take Tessalon daily or as needed for cough.  Encouraged patient to increase daily water intake to 64 ounces per day while taking these medications.  Advised if symptoms worsen and/or unresolved please follow-up with PCP or here for further evaluation.     ED Prescriptions     Medication Sig Dispense Auth. Provider   oseltamivir (TAMIFLU) 75 MG capsule Take 1 capsule (75 mg total) by mouth every 12 (twelve) hours. 10 capsule Trevor Iha, FNP    benzonatate (TESSALON) 200 MG capsule Take 1 capsule (200 mg total) by  mouth 3 (three) times daily as needed for up to 7 days. 40 capsule Eliezer Lofts, FNP      PDMP not reviewed this encounter.   Eliezer Lofts, [place] 10/01/22 1351

## 2022-12-02 IMAGING — CT CT ABD-PELV W/ CM
2 of 4 series · 16 of 46 positions shown, 18 images · IV contrast (omnipaque)
Comparison: None.

CLINICAL DATA: Abdominal pain

EXAM:
CT ABDOMEN AND PELVIS WITH CONTRAST
TECHNIQUE: Multidetector CT imaging of the abdomen and pelvis was performed
using the standard protocol following bolus administration of
intravenous contrast.
CONTRAST:  100mL OMNIPAQUE IOHEXOL 300 MG/ML  SOLN

[Series 3: axial st · axial · 0.86mm/px · z∈[-618,-178]mm · 13 of 96 slices shown, 15 images]
[im 4/96  soft-tissue]
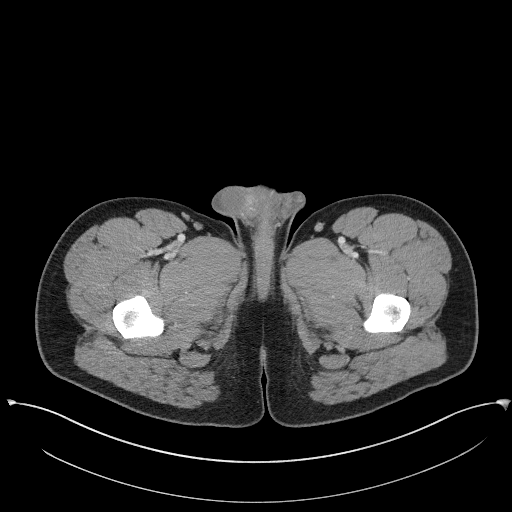
[im 4/96  bone]
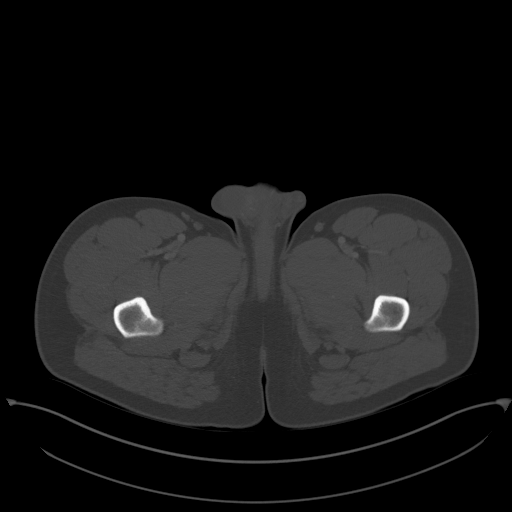
[im 11/96  soft-tissue]
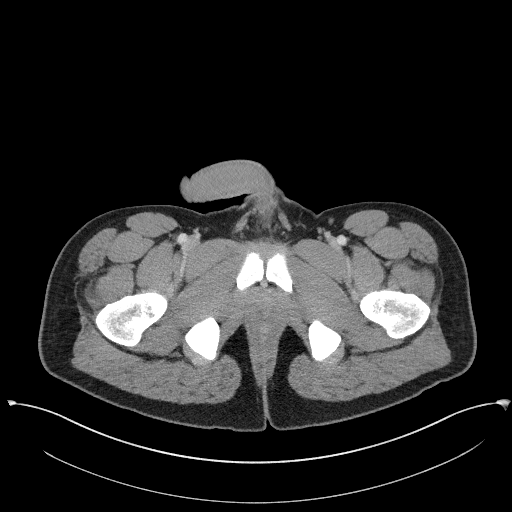
[im 19/96  soft-tissue]
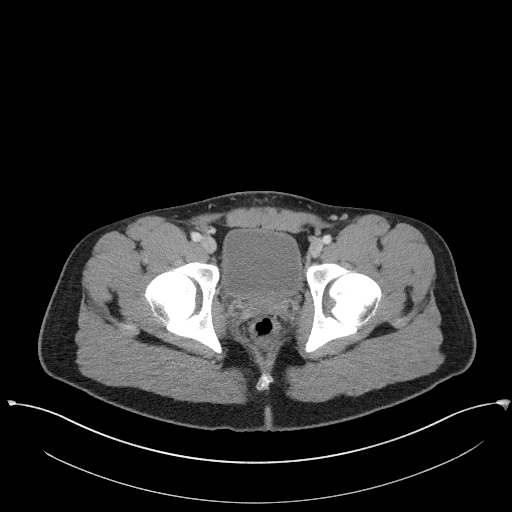
[im 26/96  soft-tissue]
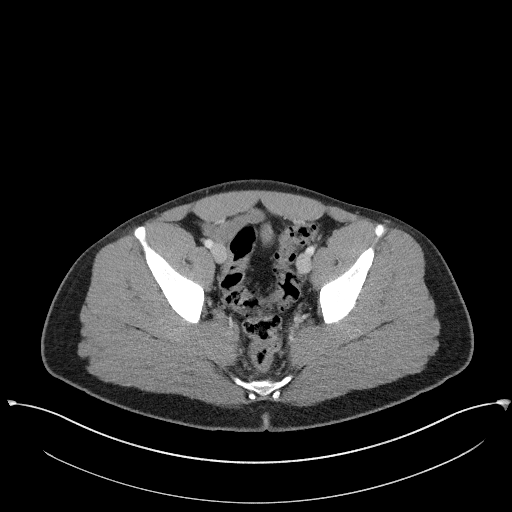
[im 33/96  soft-tissue]
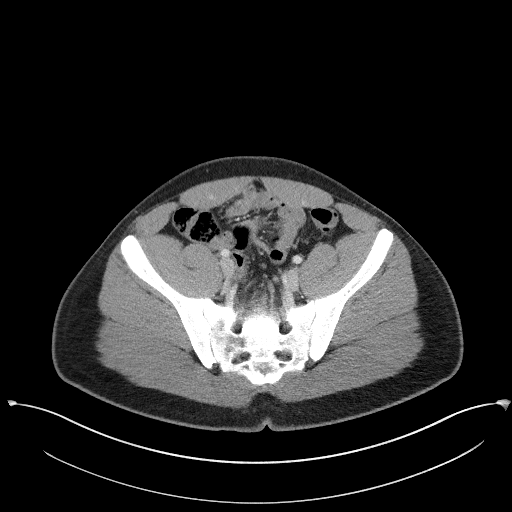
[im 41/96  soft-tissue]
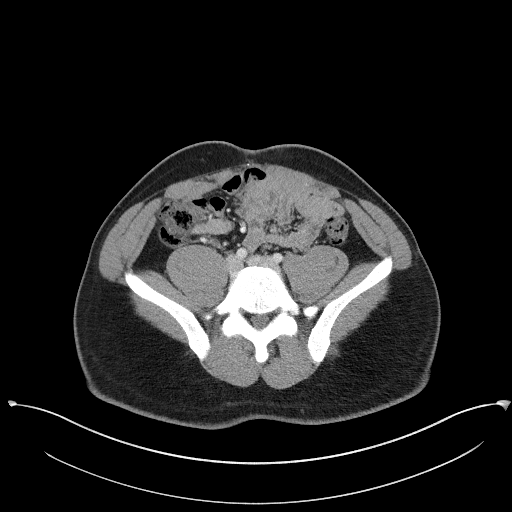
[im 48/96  soft-tissue]
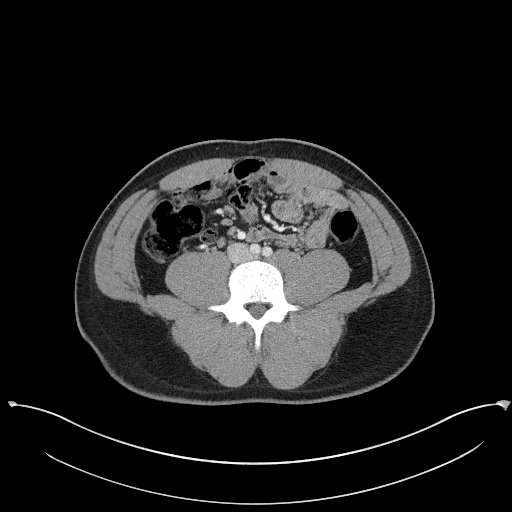
[im 55/96  soft-tissue]
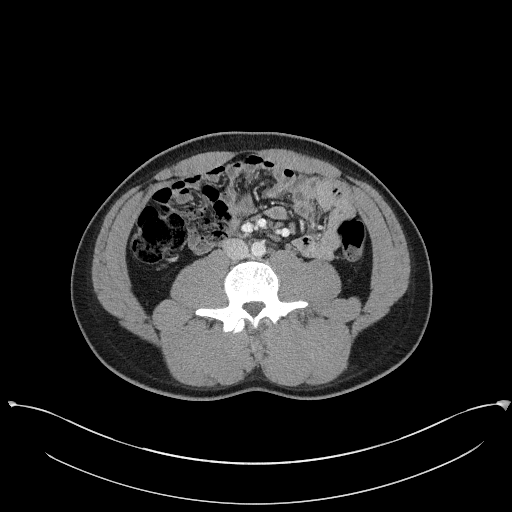
[im 63/96  soft-tissue]
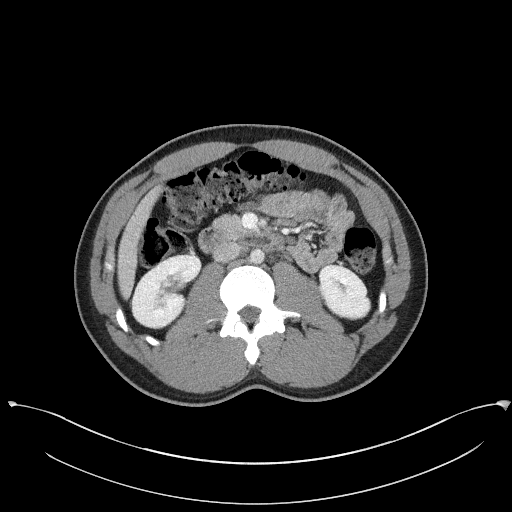
[im 63/96  bone]
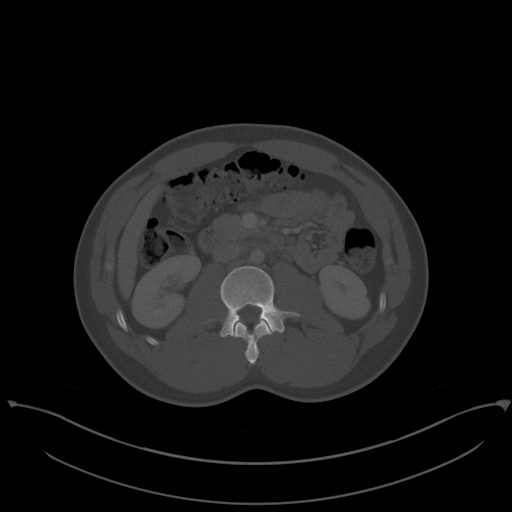
[im 70/96  soft-tissue]
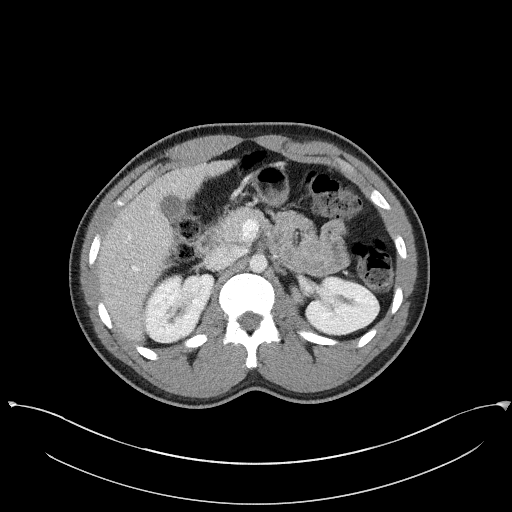
[im 77/96  soft-tissue]
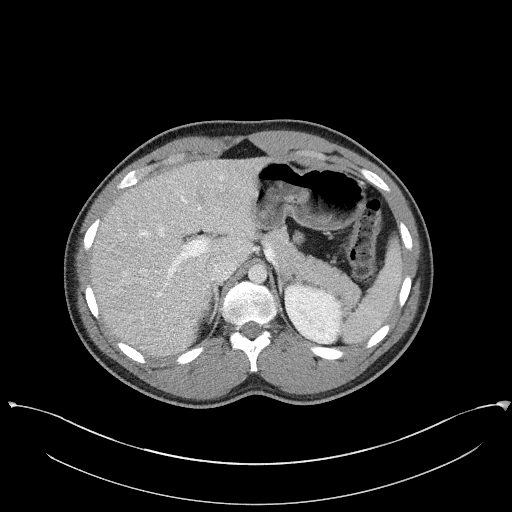
[im 85/96  soft-tissue]
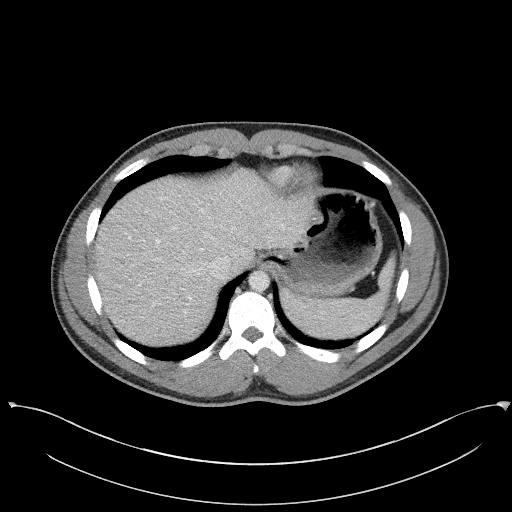
[im 92/96  soft-tissue]
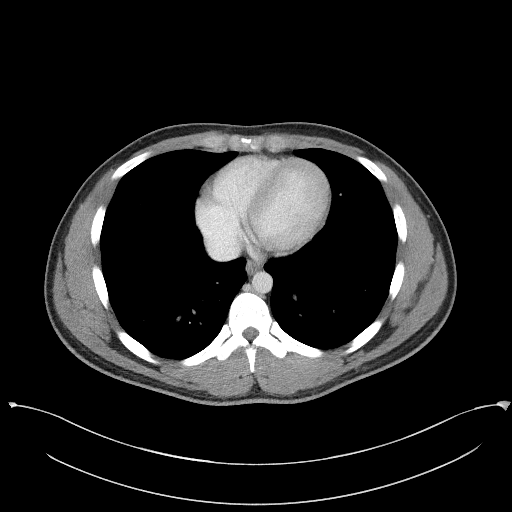

[Series 6: coronal st · coronal · 0.70mm/px · 3 of 81 slices shown]
[im 27/81  soft-tissue]
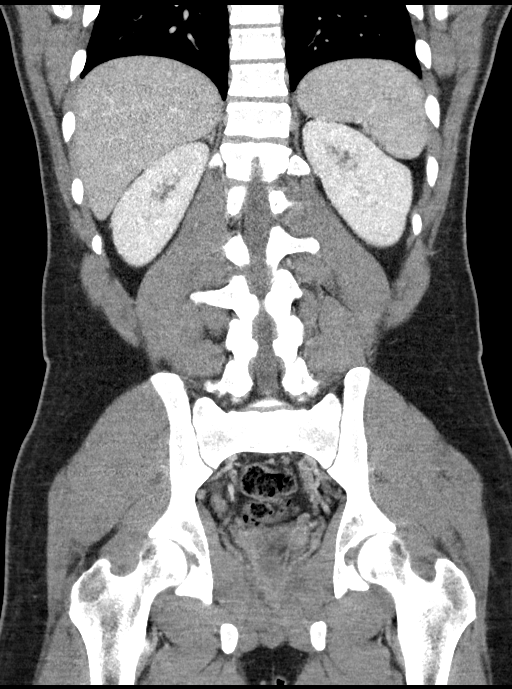
[im 36/81  soft-tissue]
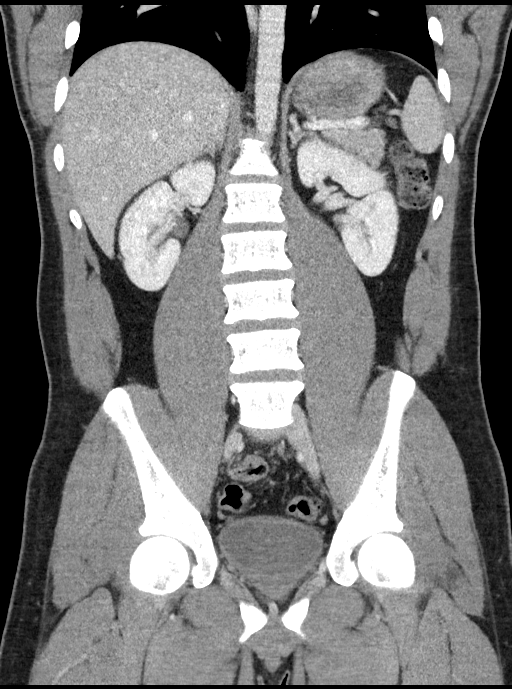
[im 45/81  soft-tissue]
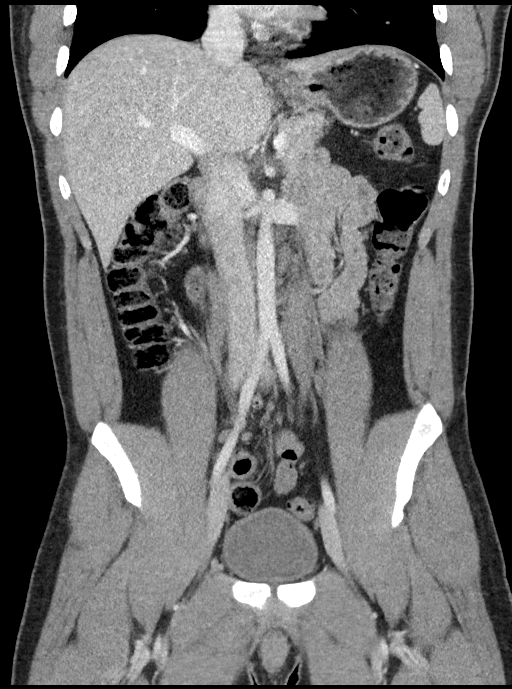

[16 of 46 positions shown; findings below may reference images not displayed]

FINDINGS: Lower chest: Lung bases are clear. No effusions. Heart is normal
size.

Hepatobiliary: No focal hepatic abnormality. Gallbladder
unremarkable.

Pancreas: No focal abnormality or ductal dilatation.

Spleen: No focal abnormality.  Normal size.

Adrenals/Urinary Tract: No adrenal abnormality. No focal renal
abnormality. No stones or hydronephrosis. Urinary bladder is
unremarkable.

Stomach/Bowel: Normal appendix. Stomach, large and small bowel
grossly unremarkable.

Vascular/Lymphatic: No evidence of aneurysm or adenopathy.

Reproductive: No visible focal abnormality.

Other: No free fluid or free air.  No visible ventral wall hernia.

Musculoskeletal: No acute bony abnormality.
IMPRESSION: No acute findings in the abdomen or pelvis.

## 2023-01-03 ENCOUNTER — Ambulatory Visit
Admission: EM | Admit: 2023-01-03 | Discharge: 2023-01-03 | Disposition: A | Discharge status: Home / Self Care | Payer: Medicaid Other | Attending: Family Medicine | Admitting: Family Medicine

## 2023-01-03 ENCOUNTER — Ambulatory Visit (INDEPENDENT_AMBULATORY_CARE_PROVIDER_SITE_OTHER): Payer: Medicaid Other | Admitting: Family Medicine

## 2023-01-03 ENCOUNTER — Encounter: Payer: Self-pay | Admitting: Emergency Medicine

## 2023-01-03 ENCOUNTER — Encounter: Payer: Self-pay | Admitting: Family Medicine

## 2023-01-03 VITALS — BP 126/79 | HR 73 | Temp 98.2°F | Resp 18 | Ht 67.0 in | Wt 175.4 lb

## 2023-01-03 DIAGNOSIS — F419 Anxiety disorder, unspecified: Secondary | ICD-10-CM | POA: Diagnosis not present

## 2023-01-03 DIAGNOSIS — Z7251 High risk heterosexual behavior: Secondary | ICD-10-CM

## 2023-01-03 DIAGNOSIS — Z202 Contact with and (suspected) exposure to infections with a predominantly sexual mode of transmission: Secondary | ICD-10-CM | POA: Diagnosis present

## 2023-01-03 DIAGNOSIS — R300 Dysuria: Secondary | ICD-10-CM | POA: Diagnosis not present

## 2023-01-03 DIAGNOSIS — Z7689 Persons encountering health services in other specified circumstances: Secondary | ICD-10-CM | POA: Diagnosis not present

## 2023-01-03 DIAGNOSIS — F32A Depression, unspecified: Secondary | ICD-10-CM

## 2023-01-03 DIAGNOSIS — Z113 Encounter for screening for infections with a predominantly sexual mode of transmission: Secondary | ICD-10-CM | POA: Insufficient documentation

## 2023-01-03 LAB — POCT URINALYSIS DIP (CLINITEK)
Bilirubin, UA: NEGATIVE
Blood, UA: NEGATIVE
Glucose, UA: NEGATIVE mg/dL
Ketones, POC UA: NEGATIVE mg/dL
Leukocytes, UA: NEGATIVE
Nitrite, UA: NEGATIVE
POC PROTEIN,UA: NEGATIVE
Spec Grav, UA: 1.02 (ref 1.010–1.025)
Urobilinogen, UA: 1 E.U./dL
pH, UA: 7 (ref 5.0–8.0)

## 2023-01-03 NOTE — ED Provider Notes (Signed)
[name]    CSN: [id number] Arrival date & time: 01/03/23  0850      History   Chief Complaint Chief Complaint  Patient presents with   Exposure to STD    HPI Frank Crawford is a 27 y.o. male.   HPI 27 year old male presents with possible exposure to STD.  Reports unprotected sex with new partner last week.  Patient denies any current symptoms and request STD testing including lab work.  Past Medical History:  Diagnosis Date   ADHD (attention deficit hyperactivity disorder)     There are no problems to display for this patient.   Past Surgical History:  Procedure Laterality Date   HERNIA REPAIR         Home Medications    Prior to Admission medications   Medication Sig Start Date End Date Taking? Authorizing Provider  ibuprofen (ADVIL) 800 MG tablet Take 1 tablet (800 mg total) by mouth 3 (three) times daily. 05/26/22   Eustace Moore, MD  oseltamivir (TAMIFLU) 75 MG capsule Take 1 capsule (75 mg total) by mouth every 12 (twelve) hours. 10/01/22   Trevor Iha, FNP    Family History Family History  Problem Relation Age of Onset   Diabetes Other    Seizures Other    Seizures Brother    Diabetes Mother    Hypertension Father     Social History Social History   Tobacco Use   Smoking status: Never   Smokeless tobacco: Former  Building services engineer Use: Former  Substance Use Topics   Alcohol use: Yes    Alcohol/week: 2.0 standard drinks of alcohol    Types: 2 Shots of liquor per week    Comment: occasional    Drug use: No     Allergies   Apple juice   Review of Systems Review of Systems  Genitourinary:  Positive for penile discharge.  All other systems reviewed and are negative.    Physical Exam Triage Vital Signs ED Triage Vitals  Enc Vitals Group     BP      Pulse      Resp      Temp      Temp src      SpO2      Weight      Height      Head Circumference      Peak Flow      Pain Score      Pain Loc       Pain Edu?      Excl. in GC?    No data found.  Updated Vital Signs BP (!) 146/87 (BP Location: Right Arm)   Pulse 69   Temp 97.7 F (36.5 C) (Oral)   Resp 18   Ht 5\' 7"  (1.702 m)   Wt 168 lb (76.2 kg)   SpO2 100%   BMI 26.31 kg/m       Physical Exam Vitals and nursing note reviewed.  Constitutional:      Appearance: Normal appearance. He is normal weight.  HENT:     Head: Normocephalic and atraumatic.     Nose: Nose normal.     Mouth/Throat:     Mouth: Mucous membranes are moist.     Pharynx: Oropharynx is clear.  Eyes:     Extraocular Movements: Extraocular movements intact.     Conjunctiva/sclera: Conjunctivae normal.     Pupils: Pupils are equal, round, and reactive to light.  Cardiovascular:  Rate and Rhythm: Normal rate and regular rhythm.     Pulses: Normal pulses.     Heart sounds: Normal heart sounds.  Pulmonary:     Effort: Pulmonary effort is normal.     Breath sounds: Normal breath sounds. No wheezing, rhonchi or rales.  Musculoskeletal:        General: Normal range of motion.     Cervical back: Normal range of motion and neck supple.  Skin:    General: Skin is warm and dry.  Neurological:     General: No focal deficit present.     Mental Status: He is alert and oriented to person, place, and time. Mental status is at baseline.  Psychiatric:        Mood and Affect: Mood normal.        Behavior: Behavior normal.        Thought Content: Thought content normal.      UC Treatments / Results  Labs (all labs ordered are listed, but only abnormal results are displayed) Labs Reviewed  CYTOLOGY, (ORAL, ANAL, URETHRAL) ANCILLARY ONLY    EKG   Radiology No results found.  Procedures Procedures (including critical care time)  Medications Ordered in UC Medications - No data to display  Initial Impression / Assessment and Plan / UC Course  I have reviewed the triage vital signs and the nursing notes.  Pertinent labs & imaging results that  were available during my care of the patient were reviewed by me and considered in my medical decision making (see chart for details).     MDM: 1.  Possible exposure to STD-Aptima swab only ordered today lab review reveals negative HIV with reflexes, hep C, RPR on 03/18/2022.  Patient reports is currently asymptomatic. Patient we will follow-up with Aptima swab results once resolved advised patient if requesting serological testing as HIV, RPR and hep C he will need to have this performed with his PCP or Health department.  Patient provided with [facility] primary care at [facility] [address] [place], [place] [address] [phone].  Patient discharged home, hemodynamically stable. Final Clinical Impressions(s) / UC Diagnoses   Final diagnoses:  Possible exposure to STD     Discharge Instructions      Patient we will follow-up with Aptima swab results once resolved advised patient if requesting serological testing as HIV, RPR and Hep C he will need to have this performed with his PCP or Health department.  Patient provided with [facility] primary care at [facility] [address], [place], [place] [address] [phone].     ED Prescriptions   None    PDMP not reviewed this encounter.   Trevor Iha, FNP 01/03/23 [phone]

## 2023-01-03 NOTE — ED Triage Notes (Signed)
Patient c/o possible exposure to STD last week.  Sex was consensual w/o protection and patient is concern.  Denies any sx's.  Requesting all labs to be done including bloodwork.

## 2023-01-03 NOTE — Discharge Instructions (Addendum)
Patient we will follow-up with Aptima swab results once resolved advised patient if requesting serological testing as HIV, RPR and Hep C he will need to have this performed with his PCP or Health department.  Patient provided with [facility] primary care at [facility] [address], [place], [place] [address] [phone].

## 2023-01-03 NOTE — Progress Notes (Signed)
New Patient Office Visit  Subjective    Patient ID: Frank Crawford, male    DOB: 11/09/1995  Age: 27 y.o. MRN: [id number]  CC:  Chief Complaint  Patient presents with   Establish Care    Patient states that he 6 months ago at Urgent care and would like to have another done today     HPI Frank Crawford presents to establish care with this practice. He is  new to me. He lives in [place] with his mother. Presents today for STD testing.   Unprotected intercourse:  He was seen this morning at the urgent care, swab for GC/Chlamydia, and trich done. He presents to primary care for HIV, RPR, and Hep C due unprotected sex. Denies penile discharge.   Dysuria:  Concerned about fatigue that he is feeling. Had back pain yesterday that resolved. No pain currently. Having urinary burning, urgency, and frequency. Concerned about UTI. Has history of UTI. Will check urine today.  Having urgency with bowel movements for few days, wonders if this is related.   Depression and anxiety: Discussed PHQ-9 score and GAD-7 score with patient.  Concerned about still living with his mom and he is worried he will always live with her. Denies plan to harm self. Contracts for safety today. Referral to psychiatry placed. Discussed medication options, wishes to address at follow-up. He is aware that he should go to the ED if he starts to have intrusive thoughts. He also reports he has a trusted adult he can go to for help.   History reviewed. Not on medications.  Chart review: 01/03/23 urgent care visit  GC/Chlamydia/ Trichomonas done. Await results.    Outpatient Encounter Medications as of 01/03/2023  Medication Sig   ibuprofen (ADVIL) 800 MG tablet Take 1 tablet (800 mg total) by mouth 3 (three) times daily.   [DISCONTINUED] oseltamivir (TAMIFLU) 75 MG capsule Take 1 capsule (75 mg total) by mouth every 12 (twelve) hours. (Patient not taking: Reported on 01/03/2023)   No facility-administered encounter  medications on file as of 01/03/2023.    Past Medical History:  Diagnosis Date   ADHD (attention deficit hyperactivity disorder)    Chlamydia    Gonorrhea    UTI (urinary tract infection)     Past Surgical History:  Procedure Laterality Date   HERNIA REPAIR      Family History  Problem Relation Age of Onset   Diabetes Mother    Hypertension Father    Seizures Brother    Healthy Brother    Diabetes Other    Seizures Other     Social History   Socioeconomic History   Marital status: Single    Spouse name: Not on file   Number of children: Not on file   Years of education: Not on file   Highest education level: Not on file  Occupational History   Not on file  Tobacco Use   Smoking status: Never   Smokeless tobacco: Former  Building services engineer Use: Every day  Substance and Sexual Activity   Alcohol use: Yes    Alcohol/week: 2.0 standard drinks of alcohol    Types: 2 Shots of liquor per week    Comment: occasional    Drug use: No   Sexual activity: Yes    Birth control/protection: Condom    Comment: not consistently  Other Topics Concern   Not on file  Social History Narrative   Not on file   Social Determinants of Health  Financial Resource Strain: Not on file  Food Insecurity: Not on file  Transportation Needs: Not on file  Physical Activity: Not on file  Stress: Not on file  Social Connections: Not on file  Intimate Partner Violence: Not on file    Review of Systems  Constitutional:  Negative for chills and fever.  Respiratory:  Negative for shortness of breath.   Cardiovascular:  Negative for chest pain.  Genitourinary:  Positive for dysuria, frequency and urgency.  Psychiatric/Behavioral:  Positive for depression (contracts for safety.) and suicidal ideas (does not have plan to harm self.). The patient is nervous/anxious.         Objective    BP 126/79   Pulse 73   Temp 98.2 F (36.8 C) (Oral)   Resp 18   Ht 5\' 7"  (1.702 m)   Wt 175  lb 6.4 oz (79.6 kg)   SpO2 99%   BMI 27.47 kg/m   Physical Exam Vitals and nursing note reviewed.  Constitutional:      General: He is not in acute distress.    Appearance: Normal appearance. He is normal weight.  Cardiovascular:     Rate and Rhythm: Normal rate and regular rhythm.     Heart sounds: Normal heart sounds.  Pulmonary:     Effort: Pulmonary effort is normal.     Breath sounds: Normal breath sounds.  Skin:    General: Skin is warm and dry.     Capillary Refill: Capillary refill takes less than 2 seconds.  Neurological:     General: No focal deficit present.     Mental Status: He is alert. Mental status is at baseline.  Psychiatric:        Mood and Affect: Mood normal.        Behavior: Behavior normal.        Thought Content: Thought content normal.        Judgment: Judgment normal.        Assessment & Plan:   Problem List Items Addressed This Visit     Dysuria  Having urinary burning, urgency, and frequency. Concerned about UTI. Has history of UTI. Urine dip: negative for infection.  Reviewed results with patient. Will await results from urgent care from earlier today.    Relevant Orders   POCT URINALYSIS DIP (CLINITEK)   Unprotected sexual intercourse  Discussed importance of consistent condom use every sexual encounter. Await results from urgent care, no symptoms present today.    Relevant Orders   Hepatitis C antibody   HIV Antibody (routine testing w rflx)   RPR   Anxiety and depression  Flowsheet Row Office Visit from 01/03/2023 in [facility] at [facility] Total Score 23  Contracts for safety. Denies plan to harm self. He is engaged with good eye contact during visit.  Has trusted adult he can turn to. Agrees to go to the ED if thoughts become intrusive. Referral placed to psychiatry today. Will follow-up in one week to discuss medication.     Relevant Orders   Ambulatory referral to Psychiatry   Establishing care with  new doctor, encounter for - Primary  Agrees with plan of care discussed.  Questions answered. Interested in starting on medication for depression and anxiety.  Wishes to discuss at follow-up in one week.    Return in about 1 week (around 01/10/2023) for depression and anxiety .   Novella Olive, FNP

## 2023-01-04 LAB — RPR: RPR Ser Ql: NONREACTIVE

## 2023-01-04 LAB — CYTOLOGY, (ORAL, ANAL, URETHRAL) ANCILLARY ONLY
Chlamydia: POSITIVE — AB
Comment: NEGATIVE
Comment: NEGATIVE
Comment: NORMAL
Neisseria Gonorrhea: NEGATIVE
Trichomonas: NEGATIVE

## 2023-01-04 LAB — HEPATITIS C ANTIBODY: Hep C Virus Ab: NONREACTIVE

## 2023-01-04 LAB — HIV ANTIBODY (ROUTINE TESTING W REFLEX): HIV Screen 4th Generation wRfx: NONREACTIVE

## 2023-01-07 ENCOUNTER — Telehealth (HOSPITAL_COMMUNITY): Payer: Self-pay | Admitting: Emergency Medicine

## 2023-01-07 MED ORDER — DOXYCYCLINE HYCLATE 100 MG PO CAPS: 100.0000 mg | ORAL_CAPSULE | Freq: Two times a day (BID) | ORAL | 0 refills | Status: AC

## 2023-01-10 ENCOUNTER — Ambulatory Visit: Payer: Medicaid Other | Admitting: Family Medicine

## 2023-01-18 ENCOUNTER — Encounter: Payer: Self-pay | Admitting: Emergency Medicine

## 2023-01-18 ENCOUNTER — Ambulatory Visit
Admission: EM | Admit: 2023-01-18 | Discharge: 2023-01-18 | Disposition: A | Discharge status: Home / Self Care | Payer: Medicaid Other | Attending: Family Medicine | Admitting: Family Medicine

## 2023-01-18 ENCOUNTER — Other Ambulatory Visit: Payer: Self-pay

## 2023-01-18 DIAGNOSIS — L03213 Periorbital cellulitis: Secondary | ICD-10-CM | POA: Diagnosis not present

## 2023-01-18 MED ORDER — AMOXICILLIN-POT CLAVULANATE 875-125 MG PO TABS: ORAL_TABLET | ORAL | 0 refills | Status: DC

## 2023-01-18 NOTE — ED Provider Notes (Signed)
[facility]    CSN: [id number] Arrival date & time: 01/18/23  1500      History   Chief Complaint Chief Complaint  Patient presents with  . Eye Pain    HPI Frank Crawford is a 27 y.o. male.    Eye Pain  Past Medical History:  Diagnosis Date  . ADHD (attention deficit hyperactivity disorder)   . Chlamydia   . Gonorrhea   . UTI (urinary tract infection)     Patient Active Problem List   Diagnosis Date Noted  . Dysuria 01/03/2023  . Unprotected sexual intercourse 01/03/2023  . Anxiety and depression 01/03/2023  . Establishing care with new doctor, encounter for 01/03/2023    Past Surgical History:  Procedure Laterality Date  . HERNIA REPAIR         Home Medications    Prior to Admission medications   Medication Sig Start Date End Date Taking? Authorizing Provider  amoxicillin-clavulanate (AUGMENTIN) 875-125 MG tablet Take one tab PO Q12hr for one week. 01/18/23  Yes Lattie Haw, MD  ibuprofen (ADVIL) 800 MG tablet Take 1 tablet (800 mg total) by mouth 3 (three) times daily. 05/26/22   Eustace Moore, MD    Family History Family History  Problem Relation Age of Onset  . Diabetes Mother   . Hypertension Father   . Seizures Brother   . Healthy Brother   . Diabetes Other   . Seizures Other     Social History Social History   Tobacco Use  . Smoking status: Never  . Smokeless tobacco: Former  Advertising account planner  . Vaping Use: Every day  Substance Use Topics  . Alcohol use: Yes    Alcohol/week: 2.0 standard drinks of alcohol    Types: 2 Shots of liquor per week    Comment: occasional   . Drug use: No     Allergies   Apple juice   Review of Systems Review of Systems  Eyes:  Positive for pain.    Physical Exam Triage Vital Signs ED Triage Vitals [01/18/23 1534]  Enc Vitals Group     BP (!) 149/91     Pulse Rate 89     Resp 18     Temp 98 F (36.7 C)     Temp Source Oral     SpO2 96 %     Weight      Height      Head  Circumference      Peak Flow      Pain Score 8     Pain Loc      Pain Edu?      Excl. in GC?    No data found.  Updated Vital Signs BP (!) 149/91 (BP Location: Left Arm)   Pulse 89   Temp 98 F (36.7 C) (Oral)   Resp 18   SpO2 96%   Visual Acuity Right Eye Distance:   Left Eye Distance:   Bilateral Distance:    Right Eye Near:   Left Eye Near:    Bilateral Near:     Physical Exam Eyes:     UC Treatments / Results  Labs (all labs ordered are listed, but only abnormal results are displayed) Labs Reviewed - No data to display  EKG   Radiology No results found.  Procedures Procedures (including critical care time)  Medications Ordered in UC Medications - No data to display  Initial Impression / Assessment and Plan / UC Course  I have  reviewed the triage vital signs and the nursing notes.  Pertinent labs & imaging results that were available during my care of the patient were reviewed by me and considered in my medical decision making (see chart for details).    Begin Augmentin Q12hr for one week.  Followup with ophthalmologist if not improving one week.  Final Clinical Impressions(s) / UC Diagnoses   Final diagnoses:  Preseptal cellulitis of right eye     Discharge Instructions      To help relieve discomfort, place a clean washcloth that is wet with warm water over your eye. Leave the washcloth on for a few minutes, then remove it. Drink plenty of fluids. May take Tylenol as needed for pain.  If symptoms become significantly worse during the night or over the weekend, proceed to the local emergency room.     ED Prescriptions     Medication Sig Dispense Auth. Provider   amoxicillin-clavulanate (AUGMENTIN) 875-125 MG tablet Take one tab PO Q12hr for one week. 14 tablet Lattie Haw, MD

## 2023-01-18 NOTE — ED Triage Notes (Signed)
Patient presents to [facility] for evaluation of right eye pain for a few days.  Woke up to facial swelling and right eye pain and redness a few days ago, hasn't worsened, but not getting any better.  Painful when touched.  Denies known injury.

## 2023-01-18 NOTE — Discharge Instructions (Addendum)
To help relieve discomfort, place a clean washcloth that is wet with warm water over your eye. Leave the washcloth on for a few minutes, then remove it. Drink plenty of fluids. May take Tylenol as needed for pain.  If symptoms become significantly worse during the night or over the weekend, proceed to the local emergency room.

## 2023-01-19 ENCOUNTER — Telehealth: Payer: Self-pay | Admitting: Emergency Medicine

## 2023-01-19 NOTE — Telephone Encounter (Signed)
Call to see how Frank Crawford was today - no answer

## 2023-01-25 ENCOUNTER — Ambulatory Visit (HOSPITAL_COMMUNITY): Payer: Medicaid Other | Admitting: Psychiatry

## 2023-02-12 ENCOUNTER — Ambulatory Visit (HOSPITAL_COMMUNITY): Payer: Medicaid Other | Admitting: Psychiatry

## 2023-03-05 ENCOUNTER — Ambulatory Visit
Admission: EM | Admit: 2023-03-05 | Discharge: 2023-03-05 | Disposition: A | Discharge status: Home / Self Care | Payer: Medicaid Other | Attending: Family Medicine | Admitting: Family Medicine

## 2023-03-05 DIAGNOSIS — N3000 Acute cystitis without hematuria: Secondary | ICD-10-CM | POA: Insufficient documentation

## 2023-03-05 LAB — POCT URINALYSIS DIP (MANUAL ENTRY)
Bilirubin, UA: NEGATIVE
Blood, UA: NEGATIVE
Glucose, UA: NEGATIVE mg/dL
Ketones, POC UA: NEGATIVE mg/dL
Nitrite, UA: NEGATIVE
Protein Ur, POC: NEGATIVE mg/dL
Spec Grav, UA: 1.025 (ref 1.010–1.025)
Urobilinogen, UA: 1 E.U./dL
pH, UA: 6.5 (ref 5.0–8.0)

## 2023-03-05 MED ORDER — SULFAMETHOXAZOLE-TRIMETHOPRIM 800-160 MG PO TABS: 1.0000 | ORAL_TABLET | Freq: Two times a day (BID) | ORAL | 0 refills | Status: AC

## 2023-03-05 NOTE — Discharge Instructions (Addendum)
Advised patient to take medication as directed with food to completion.  Encouraged increase daily water intake to 64 ounces per day while taking this medication.  Advised we will follow-up with urine culture results once received.  Advised if symptoms worsen and/or unresolved please follow-up with PCP or here for further evaluation. 

## 2023-03-05 NOTE — ED Provider Notes (Signed)
[name]    CSN: [id number] Arrival date & time: 03/05/23  1240      History   Chief Complaint Chief Complaint  Patient presents with   Back Pain    Lower   Urinary Frequency    HPI Frank Crawford is a 27 y.o. male.   HPI 27 year old male presents with lower back pain and urinary frequency x 3 days.  Patient denies concern for STD's today.  PMH significant for history of GC chlamydia, urinary tract infection/dysuria, and ADHD.  Past Medical History:  Diagnosis Date   ADHD (attention deficit hyperactivity disorder)    Chlamydia    Gonorrhea    UTI (urinary tract infection)     Patient Active Problem List   Diagnosis Date Noted   Dysuria 01/03/2023   Unprotected sexual intercourse 01/03/2023   Anxiety and depression 01/03/2023   Establishing care with new doctor, encounter for 01/03/2023    Past Surgical History:  Procedure Laterality Date   HERNIA REPAIR         Home Medications    Prior to Admission medications   Medication Sig Start Date End Date Taking? Authorizing Provider  sulfamethoxazole-trimethoprim (BACTRIM DS) 800-160 MG tablet Take 1 tablet by mouth 2 (two) times daily for 7 days. 03/05/23 03/12/23 Yes Trevor Iha, FNP  ibuprofen (ADVIL) 800 MG tablet Take 1 tablet (800 mg total) by mouth 3 (three) times daily. 05/26/22   Eustace Moore, MD    Family History Family History  Problem Relation Age of Onset   Diabetes Mother    Hypertension Father    Seizures Brother    Healthy Brother    Diabetes Other    Seizures Other     Social History Social History   Tobacco Use   Smoking status: Never   Smokeless tobacco: Former  Building services engineer Use: Every day  Substance Use Topics   Alcohol use: Yes    Alcohol/week: 2.0 standard drinks of alcohol    Types: 2 Shots of liquor per week    Comment: occasional    Drug use: No     Allergies   Apple juice   Review of Systems Review of Systems  Genitourinary:   Positive for frequency.  Musculoskeletal:  Positive for back pain.  All other systems reviewed and are negative.    Physical Exam Triage Vital Signs ED Triage Vitals  Enc Vitals Group     BP      Pulse      Resp      Temp      Temp src      SpO2      Weight      Height      Head Circumference      Peak Flow      Pain Score      Pain Loc      Pain Edu?      Excl. in GC?    No data found.  Updated Vital Signs BP (!) 136/90 (BP Location: Right Arm)   Pulse 73   Temp 97.8 F (36.6 C) (Oral)   Resp 17   SpO2 98%      Physical Exam Vitals and nursing note reviewed.  Constitutional:      Appearance: Normal appearance. He is normal weight.  HENT:     Head: Normocephalic and atraumatic.     Mouth/Throat:     Mouth: Mucous membranes are moist.     Pharynx: Oropharynx  is clear.  Eyes:     Extraocular Movements: Extraocular movements intact.     Conjunctiva/sclera: Conjunctivae normal.     Pupils: Pupils are equal, round, and reactive to light.  Cardiovascular:     Rate and Rhythm: Normal rate and regular rhythm.     Pulses: Normal pulses.     Heart sounds: Normal heart sounds.  Pulmonary:     Effort: Pulmonary effort is normal.     Breath sounds: Normal breath sounds. No wheezing, rhonchi or rales.  Abdominal:     General: There is no distension.     Palpations: Abdomen is soft. There is no mass.     Tenderness: There is no guarding or rebound.     Hernia: No hernia is present.  Musculoskeletal:        General: Normal range of motion.     Cervical back: Normal range of motion and neck supple.  Skin:    General: Skin is warm and dry.  Neurological:     General: No focal deficit present.     Mental Status: He is alert and oriented to person, place, and time. Mental status is at baseline.  Psychiatric:        Mood and Affect: Mood normal.        Behavior: Behavior normal.      UC Treatments / Results  Labs (all labs ordered are listed, but only  abnormal results are displayed) Labs Reviewed  POCT URINALYSIS DIP (MANUAL ENTRY) - Abnormal; Notable for the following components:      Result Value   Leukocytes, UA Trace (*)    All other components within normal limits  URINE CULTURE    EKG   Radiology No results found.  Procedures Procedures (including critical care time)  Medications Ordered in UC Medications - No data to display  Initial Impression / Assessment and Plan / UC Course  I have reviewed the triage vital signs and the nursing notes.  Pertinent labs & imaging results that were available during my care of the patient were reviewed by me and considered in my medical decision making (see chart for details).     MDM: 1.  Acute cystitis without hematuria-UA revealed above, urine culture ordered, Rx'd Bactrim DS 800/160 mg tablet twice daily x 7 days. Advised patient to take medication as directed with food to completion.  Encouraged increase daily water intake to 64 ounces per day while taking this medication.  Advised we will follow-up with urine culture results once received.  Advised if symptoms worsen and/or unresolved please follow-up with PCP or here for further evaluation.  Patient discharged home, hemodynamically stable Final Clinical Impressions(s) / UC Diagnoses   Final diagnoses:  Acute cystitis without hematuria     Discharge Instructions      Advised patient to take medication as directed with food to completion.  Encouraged increase daily water intake to 64 ounces per day while taking this medication.  Advised we will follow-up with urine culture results once received.  Advised if symptoms worsen and/or unresolved please follow-up with PCP or here for further evaluation.     ED Prescriptions     Medication Sig Dispense Auth. Provider   sulfamethoxazole-trimethoprim (BACTRIM DS) 800-160 MG tablet Take 1 tablet by mouth 2 (two) times daily for 7 days. 14 tablet Trevor Iha, FNP      PDMP  not reviewed this encounter.   Trevor Iha, FNP 03/05/23 1435

## 2023-03-05 NOTE — ED Triage Notes (Addendum)
Pt c/o lower back pain and urinary frequency x 3 days. Denies concern for STDs.

## 2023-03-06 LAB — URINE CULTURE: Culture: NO GROWTH

## 2024-03-08 ENCOUNTER — Telehealth: Payer: Self-pay | Admitting: Family Medicine

## 2024-03-08 ENCOUNTER — Encounter: Payer: Self-pay | Admitting: Family Medicine

## 2024-03-08 NOTE — Progress Notes (Signed)
 Pt did not show for visit [facility]

## 2024-03-09 ENCOUNTER — Encounter: Payer: Self-pay | Admitting: Family Medicine

## 2024-03-09 ENCOUNTER — Ambulatory Visit: Payer: Self-pay | Admitting: Family Medicine

## 2024-03-09 VITALS — BP 138/84 | HR 89 | Temp 98.5°F | Resp 20 | Ht 67.0 in | Wt 172.3 lb

## 2024-03-09 DIAGNOSIS — Z113 Encounter for screening for infections with a predominantly sexual mode of transmission: Secondary | ICD-10-CM

## 2024-03-09 DIAGNOSIS — Z7251 High risk heterosexual behavior: Secondary | ICD-10-CM

## 2024-03-09 NOTE — Assessment & Plan Note (Addendum)
 Has been with partner who recently found out he is positive for HIV. Unfortunately, there has been further exposure after taking a trip to [place]. Will test today for Hep C, HIV, RPR, chlamydia, gonorrhea, and trich. Describes feeling fatigue despite sleeping well. Reports feeling depressed, no thoughts of self harm.  Discussed the need to stop having unprotected intercourse immediately. Information provided on safe sexual practice. Condom use consistently.  Discussed next steps if he is positive for HIV.

## 2024-03-09 NOTE — Progress Notes (Signed)
 Established Patient Office Visit  Subjective   Patient ID: Frank Crawford, male    DOB: [birth year]  Age: 28 y.o. MRN: [id number]  Chief Complaint  Patient presents with   Exposure to STD    Presents for STD testing. In [place] and was exposed to a different partner while there. Returned home 6/10 from [place] trip.  Has been with one partner since 6/7 other than the one time in [place] just recently.  Reports his partner has tested positive for HIV.  Patient has not been tested since being with this partner. Has been with this partner.  History of chlamydia in April.  Patient  not currently on prevention for HIV.  Denies penile discharge. No flu like symptoms. Felt like he had Covid in September last year. Fatigue currently.  Sleeps well at night.  Feeling depressed with recent partner diagnosis.  No thoughts of suicide.          Review of Systems  Constitutional:  Positive for malaise/fatigue. Negative for chills, fever and weight loss.  Psychiatric/Behavioral:  Positive for depression. Negative for suicidal ideas. The patient does not have insomnia.       Objective:     BP 138/84 (BP Location: Left Arm, Cuff Size: Normal)   Pulse 89   Temp 98.5 F (36.9 C) (Oral)   Resp 20   Ht 5' 7 (1.702 m)   Wt 172 lb 4.8 oz (78.2 kg)   SpO2 98%   BMI 26.99 kg/m  BP Readings from Last 3 Encounters:  03/09/24 138/84  03/05/23 (!) 136/90  01/18/23 (!) 149/91      Physical Exam Vitals and nursing note reviewed.  Constitutional:      General: He is not in acute distress.    Appearance: Normal appearance.   Cardiovascular:     Rate and Rhythm: Normal rate.  Pulmonary:     Effort: Pulmonary effort is normal.   Skin:    General: Skin is warm and dry.   Neurological:     General: No focal deficit present.     Mental Status: He is alert. Mental status is at baseline.   Psychiatric:        Mood and Affect: Mood normal.        Behavior: Behavior normal.        Thought  Content: Thought content normal.        Judgment: Judgment normal.     No results found for any visits on 03/09/24.    The ASCVD Risk score (Arnett DK, et al., 2019) failed to calculate for the following reasons:   The 2019 ASCVD risk score is only valid for ages 29 to 70    Assessment & Plan:   Problem List Items Addressed This Visit     Unprotected sexual intercourse   Has been with partner who recently found out he is positive for HIV. Unfortunately, there has been further exposure after taking a trip to [place]. Will test today for Hep C, HIV, RPR, chlamydia, gonorrhea, and trich. Describes feeling fatigue despite sleeping well. Reports feeling depressed, no thoughts of self harm.  Discussed the need to stop having unprotected intercourse immediately. Information provided on safe sexual practice. Condom use consistently.  Discussed next steps if he is positive for HIV.         Relevant Orders   Chlamydia/Gonococcus/Trichomonas, NAA   Hepatitis C antibody   HIV Antibody (routine testing w rflx)   RPR   Screening examination for sexually  transmitted disease - Primary   Relevant Orders   Chlamydia/Gonococcus/Trichomonas, NAA   Hepatitis C antibody   HIV Antibody (routine testing w rflx)   RPR  Agrees with plan of care discussed.  Questions answered.   Return for follow-up based on results of testing .    Mickiel Albany, FNP

## 2024-03-10 ENCOUNTER — Ambulatory Visit: Payer: Self-pay | Admitting: Family Medicine

## 2024-03-10 DIAGNOSIS — Z9189 Other specified personal risk factors, not elsewhere classified: Secondary | ICD-10-CM

## 2024-03-10 LAB — HEPATITIS C ANTIBODY: Hep C Virus Ab: NONREACTIVE

## 2024-03-10 LAB — HIV ANTIBODY (ROUTINE TESTING W REFLEX): HIV Screen 4th Generation wRfx: NONREACTIVE

## 2024-03-10 LAB — CHLAMYDIA/GONOCOCCUS/TRICHOMONAS, NAA
Chlamydia by NAA: NEGATIVE
Gonococcus by NAA: NEGATIVE
Trich vag by NAA: NEGATIVE

## 2024-03-10 LAB — RPR: RPR Ser Ql: NONREACTIVE

## 2024-03-11 DIAGNOSIS — Z9189 Other specified personal risk factors, not elsewhere classified: Secondary | ICD-10-CM | POA: Insufficient documentation

## 2024-03-12 ENCOUNTER — Telehealth: Payer: Self-pay

## 2024-03-12 NOTE — Telephone Encounter (Signed)
 Frank Crawford hall from [employer] called in regards of pt current health status.

## 2024-03-13 NOTE — Progress Notes (Unsigned)
 NEW REFERRAL TO CPP CLINIC    Date:  03/13/2024   HPI: Frank Crawford is a 28 y.o. male who presents to the [facility] pharmacy clinic to discuss and initiate PrEP.  Insured   []    Uninsured  [x]    Patient Active Problem List   Diagnosis Date Noted   At high risk for exposure to HIV 03/11/2024   Dysuria 01/03/2023   Unprotected sexual intercourse 01/03/2023   Anxiety and depression 01/03/2023   Screening examination for sexually transmitted disease 01/03/2023    Patient's Medications  New Prescriptions   No medications on file  Previous Medications   IBUPROFEN  (ADVIL ) 800 MG TABLET    Take 1 tablet (800 mg total) by mouth 3 (three) times daily.  Modified Medications   No medications on file  Discontinued Medications   No medications on file    Allergies: Allergies  Allergen Reactions   Apple Juice Nausea And Vomiting    applesauce    Past Medical History: Past Medical History:  Diagnosis Date   ADHD (attention deficit hyperactivity disorder)    Chlamydia    Gonorrhea    UTI (urinary tract infection)     Social History: Social History   Socioeconomic History   Marital status: Single    Spouse name: Not on file   Number of children: Not on file   Years of education: Not on file   Highest education level: Not on file  Occupational History   Not on file  Tobacco Use   Smoking status: Never   Smokeless tobacco: Former  Advertising account planner   Vaping status: Every Day  Substance and Sexual Activity   Alcohol use: Yes    Alcohol/week: 2.0 standard drinks of alcohol    Types: 2 Shots of liquor per week    Comment: occasional    Drug use: No   Sexual activity: Yes    Birth control/protection: Condom    Comment: not consistently  Other Topics Concern   Not on file  Social History Narrative   Not on file   Social Drivers of Health   Financial Resource Strain: Not on file  Food Insecurity: Not on file  Transportation Needs: Not on file  Physical Activity: Not on  file  Stress: Not on file  Social Connections: Not on file        No data to display          Labs:  SCr: Lab Results  Component Value Date   CREATININE 0.76 01/10/2021   HIV Lab Results  Component Value Date   HIV Non Reactive 03/09/2024   HIV Non Reactive 01/03/2023   HIV NON-REACTIVE 08/23/2021   Hepatitis B No results found for: HEPBSAB, HEPBSAG, HEPBCAB Hepatitis C No results found for: HEPCAB, HCVRNAPCRQN Hepatitis A No results found for: HAV RPR and STI Lab Results  Component Value Date   LABRPR Non Reactive 03/09/2024   LABRPR Non Reactive 01/03/2023   LABRPR NON-REACTIVE 08/23/2021    STI Results GC CT  01/03/2023  9:12 AM Negative  Positive   01/31/2022 10:23 AM Negative  Negative   08/23/2021 12:43 PM Negative  Positive     Assessment: Frank Crawford presents to clinic today to initiate PrEP. He is active with his newly diagnosed HIV+ partner along with other partners. Participates in *** sex and states he uses condoms 100% of the time. HIV antibody testing returned negative on 03/09/24. Will repeat testing with HIV RNA today given recent exposure from partner. Patient's recent  STI testing on 03/09/24 was also negative, so will not repeat those today. Will need baseline BMP, *** lipid profile, and hepatitis serologies today.  Patient is interested in Descovy and is not interested in injectable options. Will apply for patient assistance today. Counseled patient that Descovy is a one pill once daily regimen with or without food that can prevent HIV. Discussed the importance of taking the medication daily to provide protection and decreased adherence is associated with decreased efficacy. Also discussed how Descovy works to prevent HIV but not other STDs and encouraged the use of condoms. Counseled on what to do if dose is missed, if closer to missed dose take immediately, if closer to next dose then skip and resume normal schedule.Counseled patient that  Descovy is normally well tolerated, however some patients experience a start up syndrome with nausea, diarrhea, dizziness, and fatigue but that those should resolve soon after starting.  Advised that any nausea can be mitigated by taking it with food. I reviewed patient medications and found no drug interactions. Discussed how our PrEP process works here at the clinic including follow ups and lab monitoring every 3 months.  Unfortunately, [employer] is pulling the funding for our uninsured HIV PrEP program in 2026. Discussed this with him. Advised that he must either sign up for insurance through the marketplace, sign up for Medicaid if eligible, or apply for [facility] charity care (financial assistance) by the end of 2025. Discussed that the main costs would be the $65 office visit copay and the lab work. Advised that I will only need a HIV RNA test before each injection and that there are free STI testing options in the community. [facility] does provide financial assistance for uninsured patients for most lab work (HIV tests and RPR) but that the cytologies (STI testing) would need to be covered through the [facility] charity care. The PrEP medication (both oral and injectable) would be covered through patient assistance through the drug manufacturer still or through their insurance if they decide to sign up. Gave him the [facility] financial assistance application for him to look over. He will let me know what he decides.   Plan: - Check HIV RNA, BMP, lipid panel,  and hepatitis A/B/C serologies - Start Descovy if HIV antibody negative - Follow up with me on ***   Nicklas Barns, PharmD, CPP, BCIDP, AAHIVP Clinical Pharmacist Practitioner Infectious Diseases Clinical Pharmacist [facility] 03/13/2024, 9:20 AM

## 2024-03-16 ENCOUNTER — Telehealth: Payer: Self-pay

## 2024-03-16 ENCOUNTER — Other Ambulatory Visit (HOSPITAL_COMMUNITY): Payer: Self-pay

## 2024-03-16 NOTE — Telephone Encounter (Signed)
 [facility] Patient Advocate Encounter ? ?Insurance verification completed.   ? ?The patient is uninsured and will need patient assistance for medication. ? ?We can complete the application and will need to meet with the patient for signatures and income documentation. ? ?Clearance Coots, CPhT ?Specialty Pharmacy Patient Advocate ?[facility] ?Phone: [phone] ?Fax:  [phone]  ?

## 2024-03-18 ENCOUNTER — Other Ambulatory Visit (HOSPITAL_COMMUNITY)
Admission: RE | Admit: 2024-03-18 | Discharge: 2024-03-18 | Disposition: A | Discharge status: Home / Self Care | Payer: Self-pay | Source: Ambulatory Visit | Attending: Infectious Disease | Admitting: Infectious Disease

## 2024-03-18 ENCOUNTER — Other Ambulatory Visit: Payer: Self-pay

## 2024-03-18 ENCOUNTER — Other Ambulatory Visit (HOSPITAL_COMMUNITY): Payer: Self-pay

## 2024-03-18 ENCOUNTER — Ambulatory Visit: Payer: Self-pay | Admitting: Pharmacist

## 2024-03-18 DIAGNOSIS — Z2981 Encounter for HIV pre-exposure prophylaxis: Secondary | ICD-10-CM

## 2024-03-18 DIAGNOSIS — Z7189 Other specified counseling: Secondary | ICD-10-CM

## 2024-03-19 LAB — HEPATITIS B SURFACE ANTIBODY,QUALITATIVE: Hep B S Ab: REACTIVE — AB

## 2024-03-19 LAB — URINE CYTOLOGY ANCILLARY ONLY
Chlamydia: NEGATIVE
Comment: NEGATIVE
Comment: NORMAL
Neisseria Gonorrhea: NEGATIVE

## 2024-03-19 LAB — HEPATITIS B CORE ANTIBODY, TOTAL: Hep B Core Total Ab: NONREACTIVE

## 2024-03-20 ENCOUNTER — Encounter: Payer: Self-pay | Admitting: Pharmacist

## 2024-03-20 ENCOUNTER — Telehealth: Payer: Self-pay

## 2024-03-20 ENCOUNTER — Other Ambulatory Visit: Payer: Self-pay | Admitting: Pharmacist

## 2024-03-20 DIAGNOSIS — Z2981 Encounter for HIV pre-exposure prophylaxis: Secondary | ICD-10-CM

## 2024-03-20 LAB — LIPID PANEL
Cholesterol: 159 mg/dL (ref <200)
HDL: 49 mg/dL (ref >=40)
LDL Cholesterol (Calc): 94 mg/dL
Non-HDL Cholesterol (Calc): 110 mg/dL (ref <130)
Total CHOL/HDL Ratio: 3.2 (calc) (ref <5.0)
Triglycerides: 69 mg/dL (ref <150)

## 2024-03-20 LAB — BASIC METABOLIC PANEL WITH GFR
BUN: 17 mg/dL (ref 7–25)
CO2: 26 mmol/L (ref 20–32)
Calcium: 9.5 mg/dL (ref 8.6–10.3)
Chloride: 105 mmol/L (ref 98–110)
Creat: 0.86 mg/dL (ref 0.60–1.24)
Glucose, Bld: 93 mg/dL (ref 65–99)
Potassium: 4.3 mmol/L (ref 3.5–5.3)
Sodium: 139 mmol/L (ref 135–146)
eGFR: 123 mL/min/{1.73_m2} (ref >=60)

## 2024-03-20 LAB — HEPATITIS B SURFACE ANTIGEN: Hepatitis B Surface Ag: NONREACTIVE

## 2024-03-20 LAB — HIV-1 RNA QUANT-NO REFLEX-BLD
HIV 1 RNA Quant: NOT DETECTED {copies}/mL
HIV-1 RNA Quant, Log: NOT DETECTED {Log_copies}/mL

## 2024-03-20 LAB — HEPATITIS A ANTIBODY, TOTAL: Hepatitis A AB,Total: REACTIVE — AB

## 2024-03-20 LAB — HEPATITIS C ANTIBODY: Hepatitis C Ab: NONREACTIVE

## 2024-03-20 MED ORDER — DESCOVY 200-25 MG PO TABS: 1.0000 | ORAL_TABLET | Freq: Every day | ORAL | 2 refills | Status: DC

## 2024-03-20 NOTE — Telephone Encounter (Addendum)
 [facility] Patient Advocate Encounter   Patient has been approved for [employer] Advancing Access Patient Assistance Program for Descovy  from 04/06/24 to 04/06/25.  This assistance will make the patient's copay 00.  I have spoken with the patient and medication  will be mailed to patient home.  Prescription was fax to [phone] [facility].  Patient ID # [id number]           Patient knows to call the office with questions or concerns.  Arland Hutchinson, CPhT Specialty Pharmacy Patient [facility] for Infectious Disease Phone: [phone] Fax: [phone] 03/20/2024 10:34 AM

## 2024-03-30 ENCOUNTER — Other Ambulatory Visit (HOSPITAL_COMMUNITY): Payer: Self-pay

## 2024-03-30 NOTE — Telephone Encounter (Signed)
 Expected delivery?

## 2024-04-06 ENCOUNTER — Other Ambulatory Visit: Payer: Self-pay | Admitting: Pharmacist

## 2024-04-06 ENCOUNTER — Other Ambulatory Visit: Payer: Self-pay

## 2024-04-06 ENCOUNTER — Other Ambulatory Visit (HOSPITAL_COMMUNITY): Payer: Self-pay

## 2024-04-06 DIAGNOSIS — Z79899 Other long term (current) drug therapy: Secondary | ICD-10-CM

## 2024-04-06 MED ORDER — DESCOVY 200-25 MG PO TABS
1.0000 | ORAL_TABLET | Freq: Every day | ORAL | 0 refills | Status: AC
  Filled 2024-04-06 – 2024-04-22 (×3): qty 30, 30d supply, fill #0

## 2024-04-06 NOTE — Progress Notes (Signed)
 Specialty Pharmacy Initial Fill Coordination Note  Frank Crawford is a 28 y.o. male contacted today regarding initial fill of specialty medication(s) No data recorded  Patient requested: Descovy  Pickup date: 04/07/24  Medication will be filled on 04/07/24.   Patient is aware of 0.00 copayment.

## 2024-04-06 NOTE — Progress Notes (Signed)
 Specialty Pharmacy Initiation Note   Frank Crawford is a 28 y.o. male who will be followed by the specialty pharmacy service for RxSp HIV PrEP    Review of administration, indication, effectiveness, safety, potential side effects, storage/disposable, and missed dose instructions occurred today for patient's specialty medication(s) Emtricitabine -Tenofovir  AF (Descovy )     Patient/Caregiver did not have any additional questions or concerns.   Patient's therapy is appropriate to: Initiate    Goals Addressed             This Visit's Progress    Comply with lab assessments       Patient is on track. Patient will adhere to provider and/or lab appointments      Maintain optimal adherence to therapy       Patient is on track. Patient will maintain adherencea      Practice safe sex       Patient is on track. Patient will be evaluated at upcoming provider appointment to assess progress         Alan JINNY Geralds Specialty Pharmacist

## 2024-04-06 NOTE — Progress Notes (Signed)
 Arland received [employer] approval for patient; sending 30 days to [facility].  Alan Geralds, PharmD, CPP, BCIDP, AAHIVP Clinical Pharmacist Practitioner Infectious Diseases Clinical Pharmacist [facility] for [facility]

## 2024-04-10 ENCOUNTER — Other Ambulatory Visit: Payer: Self-pay | Admitting: Pharmacist

## 2024-04-10 DIAGNOSIS — Z79899 Other long term (current) drug therapy: Secondary | ICD-10-CM

## 2024-04-10 MED ORDER — DESCOVY 200-25 MG PO TABS: 1.0000 | ORAL_TABLET | Freq: Every day | ORAL | Status: AC

## 2024-04-10 NOTE — Progress Notes (Signed)
 Medication Samples have been provided to the patient.  Drug name: Descovy      Strength: 200/25 mg    Qty: 7 tablets (1 bottles) LOT: [id number]   Exp.Date: 3/27  Samples requested by Roylene Corn.  Dosing instructions: Take one tablet by mouth once daily.   The patient has been instructed regarding the correct time, dose, and frequency of taking this medication, including desired effects and most common side effects.   Nicklas Barns, PharmD, CPP, BCIDP, AAHIVP Clinical Pharmacist Practitioner Infectious Diseases Clinical Pharmacist [facility] for Infect[facility] Disease

## 2024-04-16 ENCOUNTER — Other Ambulatory Visit: Payer: Self-pay

## 2024-04-16 NOTE — Progress Notes (Signed)
 Medication returned to stock, call center attempted to reach patient. Medication never picked up, Frank Crawford also lvm for patient and is aware.

## 2024-04-17 ENCOUNTER — Other Ambulatory Visit (HOSPITAL_COMMUNITY): Payer: Self-pay

## 2024-04-17 ENCOUNTER — Other Ambulatory Visit: Payer: Self-pay

## 2024-04-18 ENCOUNTER — Other Ambulatory Visit (HOSPITAL_COMMUNITY): Payer: Self-pay

## 2024-04-22 ENCOUNTER — Other Ambulatory Visit: Payer: Self-pay

## 2024-04-22 ENCOUNTER — Other Ambulatory Visit (HOSPITAL_COMMUNITY): Payer: Self-pay

## 2024-04-22 NOTE — Progress Notes (Signed)
 Specialty Pharmacy Refill Coordination Note  Spoke with [name]  Frank Crawford is a 28 y.o. male contacted today regarding refills of specialty medication(s) Emtricitabine -Tenofovir  AF (Descovy )  Doses on hand: 0 (Never picked up initial fill.)   Patient requested: Pickup at [facility] date: 04/22/24  Medication will be filled on 04/22/24. Please fill at [facility].  One time fill. Per Patient Assistance Program, patient can only fill with us  once. Patient must fill with [facility] [phone]). Patient is aware and has been given phone number. Please disenroll at the time of next refill call.

## 2024-04-22 NOTE — Progress Notes (Signed)
 Disenrolling patient from [facility].   One time fill. Per Patient Assistance Program, patient can only fill with us  once. Patient must fill with [facility] [phone]). Patient is aware and has been given phone number. Please disenroll at the time of next refill call.

## 2024-06-11 ENCOUNTER — Ambulatory Visit: Payer: Self-pay | Admitting: Pharmacist

## 2024-06-21 NOTE — Progress Notes (Unsigned)
 HPI: Rylyn Ranganathan is a 28 y.o. male who presents to the [facility] pharmacy clinic for HIV PrEP follow-up.  Insured   []    Uninsured  [x]    Patient Active Problem List   Diagnosis Date Noted   At high risk for exposure to HIV 03/11/2024   Dysuria 01/03/2023   Unprotected sexual intercourse 01/03/2023   Anxiety and depression 01/03/2023   Screening examination for sexually transmitted disease 01/03/2023    Patient's Medications  New Prescriptions   No medications on file  Previous Medications   EMTRICITABINE -TENOFOVIR  AF (DESCOVY ) 200-25 MG TABLET    Take 1 tablet by mouth daily.   IBUPROFEN  (ADVIL ) 800 MG TABLET    Take 1 tablet (800 mg total) by mouth 3 (three) times daily.  Modified Medications   No medications on file  Discontinued Medications   No medications on file       03/18/2024   10:48 AM  [facility] HIV PREP FLOWSHEET RESULTS  Insurance Status Uninsured  How did you hear? FM referral  Gender at birth Male  Gender identity cis-Male  Risk for HIV In sexual relationship with HIV+ partner;Condomless vaginal or anal intercourse  Sex Partners Men only  # sex partners past 3-6 mos 3  Sex activity preferences Insertive;Oral  Condom use Yes  % condom use 25  Treated for STI? No  HIV symptoms? None  PrEP Eligibility Yes  Paper work received? Yes    Labs:  SCr: Lab Results  Component Value Date   CREATININE 0.86 03/18/2024   CREATININE 0.76 01/10/2021   HIV Lab Results  Component Value Date   HIV Non Reactive 03/09/2024   HIV Non Reactive 01/03/2023   HIV NON-REACTIVE 08/23/2021   Hepatitis B Lab Results  Component Value Date   HEPBSAB REACTIVE (A) 03/18/2024   HEPBSAG NON-REACTIVE 03/18/2024   HEPBCAB NON-REACTIVE 03/18/2024   Hepatitis C Lab Results  Component Value Date   HEPCAB NON-REACTIVE 03/18/2024   Hepatitis A Lab Results  Component Value Date   HAV REACTIVE (A) 03/18/2024   RPR and STI Lab Results  Component Value Date   LABRPR Non  Reactive 03/09/2024   LABRPR Non Reactive 01/03/2023   LABRPR NON-REACTIVE 08/23/2021    STI Results GC CT  03/18/2024 10:17 AM Negative  Negative   01/03/2023  9:12 AM Negative  Positive   01/31/2022 10:23 AM Negative  Negative   08/23/2021 12:43 PM Negative  Positive     Assessment: Primo presents to clinic today for HIV PrEP follow-up. He is currently on Descovy . He started it on 04/22/24 and has not missed any doses since. He has one bottle + 7-8 tablets still remaining at home. He reports stomach pain shortly after taking Descovy . He started taking food with Descovy  and reports that helps. Will collect HIV antibody today. Informed him we will send refills once that returns negative. Requests STI testing today, but no high risk sexual encounters noted.   Discussed the PrEP clinic funding being pulled in October. Explained he will be responsible for clinic visit fees and lab fees. Explained the only lab we need at each follow-up is HIV antibody. He reports he is trying to get insurance coverage, but the options have all been very expensive. He went to see [employer] Assistance who informed him his income is slightly more than the limit of $24,000 for [place] Medicaid. Counseled open enrollment is occurring again next month and encouraged him to apply via [email or web address].   Discussed his eligibility  for the flu, Tdap and HPV vaccines. He gratefully accepts the Tdap and HPV vaccines today. He politely declines the flu vaccine today.   Plan: -Continue Descovy  once daily for PrEP -Send refills when HIV antibody is negative -HIV antibody, oral/rectal cytologies for G/C and RPR collected today -Administered Tdap vaccine in the right deltoid and 1/3 HPV vaccine in the left deltoid -Follow-up scheduled for 10/01/24 with Alan -Call with any questions/concerns  Izetta Carl, PharmD PGY1 Pharmacy Resident

## 2024-06-22 ENCOUNTER — Other Ambulatory Visit (HOSPITAL_COMMUNITY): Payer: Self-pay

## 2024-06-22 ENCOUNTER — Ambulatory Visit: Payer: Self-pay | Admitting: Pharmacist

## 2024-06-22 ENCOUNTER — Other Ambulatory Visit: Payer: Self-pay

## 2024-06-22 DIAGNOSIS — Z79899 Other long term (current) drug therapy: Secondary | ICD-10-CM

## 2024-06-22 DIAGNOSIS — Z113 Encounter for screening for infections with a predominantly sexual mode of transmission: Secondary | ICD-10-CM

## 2024-06-22 DIAGNOSIS — Z23 Encounter for immunization: Secondary | ICD-10-CM

## 2024-06-23 ENCOUNTER — Telehealth: Payer: Self-pay

## 2024-06-23 ENCOUNTER — Other Ambulatory Visit: Payer: Self-pay | Admitting: Pharmacist

## 2024-06-23 ENCOUNTER — Other Ambulatory Visit: Payer: Self-pay

## 2024-06-23 DIAGNOSIS — Z113 Encounter for screening for infections with a predominantly sexual mode of transmission: Secondary | ICD-10-CM

## 2024-06-23 DIAGNOSIS — Z2981 Encounter for HIV pre-exposure prophylaxis: Secondary | ICD-10-CM

## 2024-06-23 LAB — RPR: RPR Ser Ql: NONREACTIVE

## 2024-06-23 LAB — CYTOLOGY, (ORAL, ANAL, URETHRAL) ANCILLARY ONLY
Chlamydia: NEGATIVE
Chlamydia: NEGATIVE
Comment: NEGATIVE
Comment: NEGATIVE
Comment: NORMAL
Comment: NORMAL
Neisseria Gonorrhea: NEGATIVE
Neisseria Gonorrhea: NEGATIVE

## 2024-06-23 LAB — HIV ANTIBODY (ROUTINE TESTING W REFLEX)
HIV 1&2 Ab, 4th Generation: NONREACTIVE
HIV FINAL INTERPRETATION: NEGATIVE

## 2024-06-23 MED ORDER — DESCOVY 200-25 MG PO TABS: 1.0000 | ORAL_TABLET | Freq: Every day | ORAL | 2 refills | Status: AC

## 2024-06-23 NOTE — Telephone Encounter (Signed)
 [facility] Patient Advocate Encounter  I faxed hardcopy script for Descovy  to [facility]) to [phone].  [facility] will call the patient to set up refill.  Arland Hutchinson, CPhT Specialty Pharmacy Patient [facility] for Infectious Disease Phone: [phone] Fax:  [phone]

## 2024-06-24 ENCOUNTER — Other Ambulatory Visit: Payer: Self-pay

## 2024-09-30 NOTE — Progress Notes (Unsigned)
" ° °  HPI: Leeam Cedrone is a 29 y.o. male who presents to the [facility] pharmacy clinic for HIV PrEP follow-up.  Patient Active Problem List   Diagnosis Date Noted   At high risk for exposure to HIV 03/11/2024   Dysuria 01/03/2023   Unprotected sexual intercourse 01/03/2023   Anxiety and depression 01/03/2023   Screening examination for sexually transmitted disease 01/03/2023    Patient's Medications  New Prescriptions   No medications on file  Previous Medications   EMTRICITABINE -TENOFOVIR  AF (DESCOVY ) 200-25 MG TABLET    Take 1 tablet by mouth daily.   IBUPROFEN  (ADVIL ) 800 MG TABLET    Take 1 tablet (800 mg total) by mouth 3 (three) times daily.  Modified Medications   No medications on file  Discontinued Medications   No medications on file       03/18/2024   10:48 AM  [facility] HIV PREP FLOWSHEET RESULTS  Insurance Status Uninsured  How did you hear? FM referral  Gender at birth Male  Gender identity cis-Male  Risk for HIV In sexual relationship with HIV+ partner;Condomless vaginal or anal intercourse  Sex Partners Men only  # sex partners past 3-6 mos 3  Sex activity preferences Insertive;Oral  Condom use Yes  % condom use 25  Treated for STI? No  HIV symptoms? None  PrEP Eligibility Yes  Paper work received? Yes    Labs:  SCr: Lab Results  Component Value Date   CREATININE 0.86 03/18/2024   CREATININE 0.76 01/10/2021   HIV Lab Results  Component Value Date   HIV NON-REACTIVE 06/22/2024   HIV Non Reactive 03/09/2024   HIV Non Reactive 01/03/2023   HIV NON-REACTIVE 08/23/2021   Hepatitis B Lab Results  Component Value Date   HEPBSAB REACTIVE (A) 03/18/2024   HEPBSAG NON-REACTIVE 03/18/2024   HEPBCAB NON-REACTIVE 03/18/2024   Hepatitis C Lab Results  Component Value Date   HEPCAB NON-REACTIVE 03/18/2024   Hepatitis A Lab Results  Component Value Date   HAV REACTIVE (A) 03/18/2024   RPR and STI Lab Results  Component Value Date   LABRPR  NON-REACTIVE 06/22/2024   LABRPR Non Reactive 03/09/2024   LABRPR Non Reactive 01/03/2023   LABRPR NON-REACTIVE 08/23/2021    STI Results GC CT  06/22/2024  2:12 PM Negative    Negative  Negative    Negative   03/18/2024 10:17 AM Negative  Negative   01/03/2023  9:12 AM Negative  Positive   01/31/2022 10:23 AM Negative  Negative   08/23/2021 12:43 PM Negative  Positive     Assessment: Rankin presents to the clinic today for PrEP follow-up. He is currently on Descovy .  *** adherence, days supply remaining *** tolerability  Labs: Last HIV ab was negative on 06/22/24;   Eligible vaccinations: HPV, Flu, COVID***  Plan: ***  Maurilio Patten, PharmD PGY1 Pharmacy Resident [facility] 09/30/2024 8:10 PM "

## 2024-10-01 ENCOUNTER — Ambulatory Visit: Payer: Self-pay | Admitting: Pharmacist

## 2024-10-01 DIAGNOSIS — Z79899 Other long term (current) drug therapy: Secondary | ICD-10-CM
